# Patient Record
Sex: Female | Born: 1975 | Race: White | Marital: Married | Smoking: Never smoker
Health system: Southern US, Community
[De-identification: ages and names within clinical notes are randomized; demographics above are authoritative.]

## PROBLEM LIST (undated history)

## (undated) MED FILL — Dexamethasone Sodium Phosphate Inj 100 MG/10ML: INTRAMUSCULAR | Qty: 1 | Status: AC

---

## 2021-03-13 ENCOUNTER — Other Ambulatory Visit: Payer: Self-pay | Admitting: Surgical Oncology

## 2021-03-13 DIAGNOSIS — R9389 Abnormal findings on diagnostic imaging of other specified body structures: Secondary | ICD-10-CM

## 2021-03-21 ENCOUNTER — Other Ambulatory Visit: Payer: Self-pay | Admitting: Surgical Oncology

## 2021-03-21 DIAGNOSIS — N6320 Unspecified lump in the left breast, unspecified quadrant: Secondary | ICD-10-CM

## 2021-03-30 ENCOUNTER — Other Ambulatory Visit: Payer: Self-pay

## 2021-03-30 ENCOUNTER — Ambulatory Visit
Admission: RE | Admit: 2021-03-30 | Discharge: 2021-03-30 | Disposition: A | Discharge status: Home / Self Care | Payer: BC Managed Care – PPO | Source: Ambulatory Visit | Attending: Surgical Oncology | Admitting: Surgical Oncology

## 2021-03-30 DIAGNOSIS — N6320 Unspecified lump in the left breast, unspecified quadrant: Secondary | ICD-10-CM

## 2021-03-30 IMAGING — MR MR BREAST BILAT WO/W CM
11 of 13 series · 29 of 48 positions shown · IV contrast (7 ml gadavist)
Comparison: Previous exam(s).
COMPARISON: Previous exam(s).

Addendum:
CLINICAL DATA: 45-year-old female with history of lumpectomy for
left breast infiltrating lobular carcinoma with metastatic axillary
adenopathy. Patient complains of left breast pain. Patient had an
MRI of the breast on [DATE] at [REDACTED] in [HOSPITAL],
[facility] which showed an determinate right breast mass. Sonographic
evaluation the right breast was performed on [DATE]. The mass
seen on MRI was not seen sonographically. MR guided core biopsy
recommended.

EXAM:
BILATERAL BREAST MRI WITH AND WITHOUT CONTRAST
TECHNIQUE: Multiplanar, multisequence MR images of both breasts were obtained
prior to and following the intravenous administration of 7 ml of
Gadavist

[Series 2: t2_tirm_tra ipat (a-p) · axial · 3.0mm · 0.78mm/px · 1 of 70 slices shown]
[im 1/70]
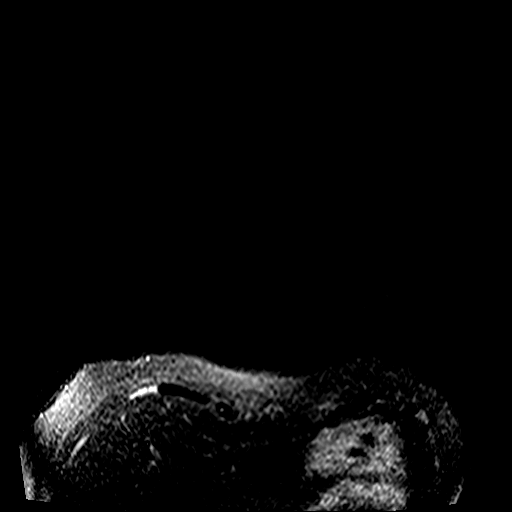

[Series 3: fl3d pre-cm non · axial · non-contrast · 1.2mm · 1.04mm/px · z∈[-138,+72]mm · 3 of 176 slices shown]
[im 1/176]
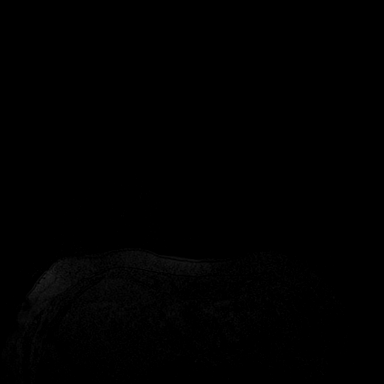
[im 88/176]
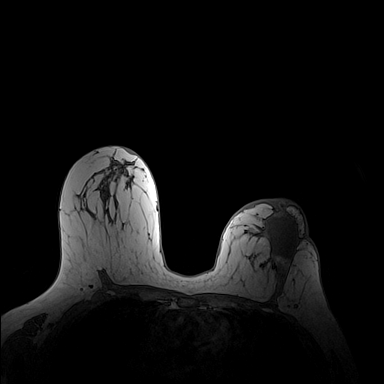
[im 176/176]
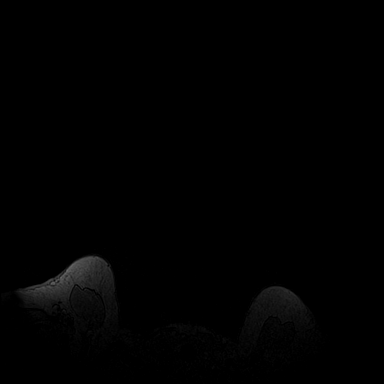

[Series 4: fl3d pre-cm · axial · non-contrast · 1.2mm · 1.04mm/px · z∈[-138,+72]mm · 3 of 176 slices shown]
[im 1/176]
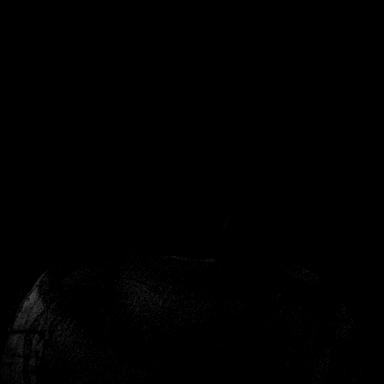
[im 88/176]
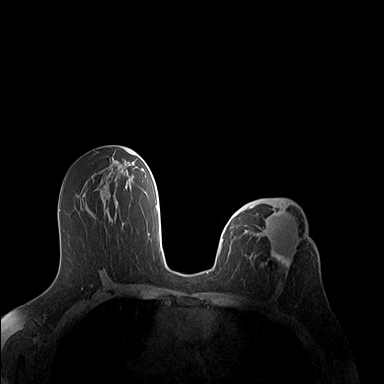
[im 176/176]
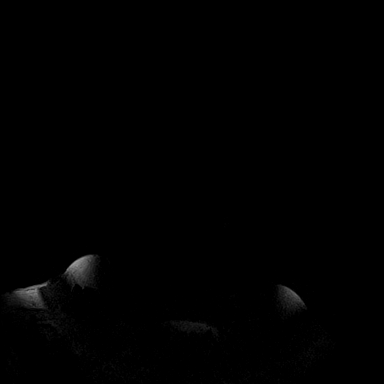

[Series 5: fl3d pre-cm 20 · axial · non-contrast · 1.2mm · 1.04mm/px · z∈[-138,+72]mm · 3 of 176 slices shown (1 of 3)]
[im 1/176]
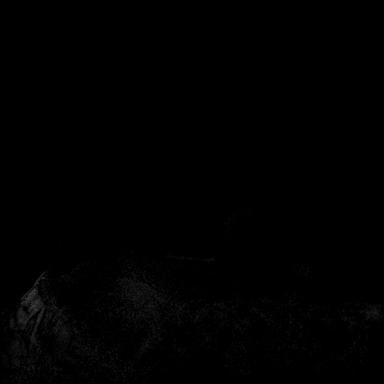
[im 88/176]
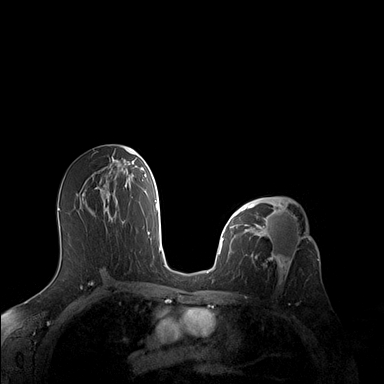
[im 176/176]
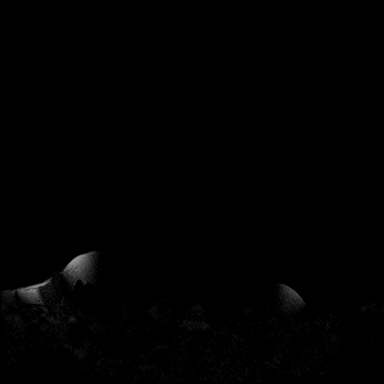

[Series 6: fl3d pre-cm 20 · axial · non-contrast · 1.2mm · 1.04mm/px · z∈[-138,+72]mm · 4 of 176 slices shown (2 of 3)]
[im 1/176]
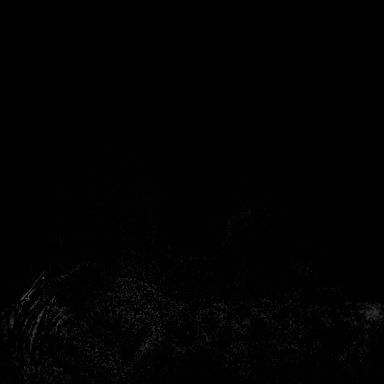
[im 59/176]
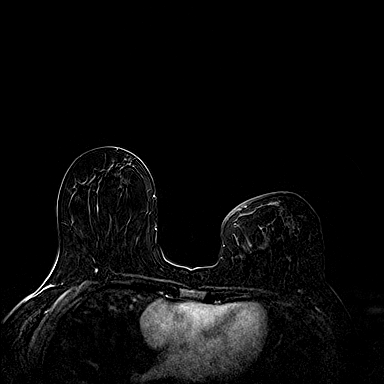
[im 117/176]
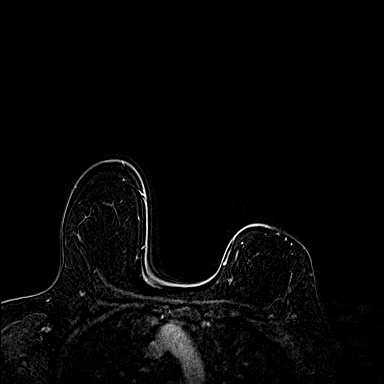
[im 176/176]
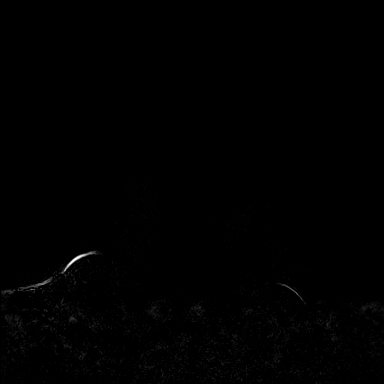

[Series 7: fl3d pre-cm 20 · axial · non-contrast · 211.2mm · 1.04mm/px · 1 of 1 slices shown (3 of 3)]
[im 1/1]
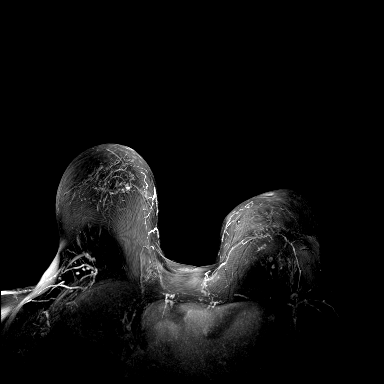

[Series 8: fl3d pre-cm 3min · axial · non-contrast · 1.2mm · 1.04mm/px · z∈[-138,+72]mm · 4 of 176 slices shown]
[im 1/176]
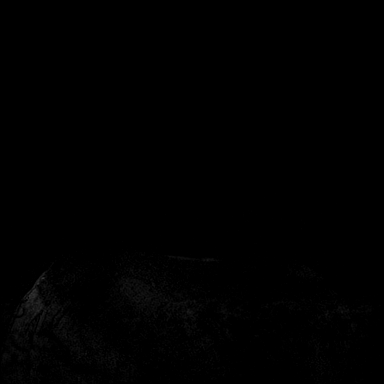
[im 59/176]
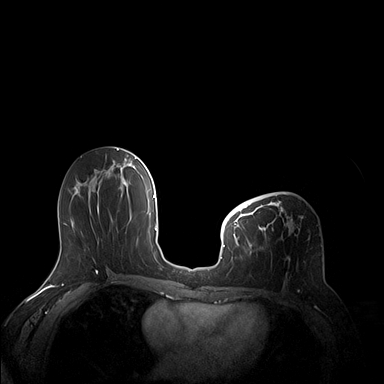
[im 117/176]
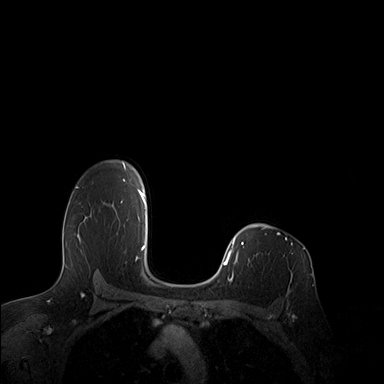
[im 176/176]
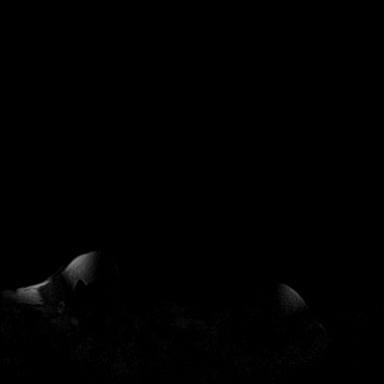

[Series 9: fl3d pre-cm 3min_sub · axial · non-contrast · 1.2mm · 1.04mm/px · z∈[-138,+72]mm · 4 of 176 slices shown]
[im 1/176]
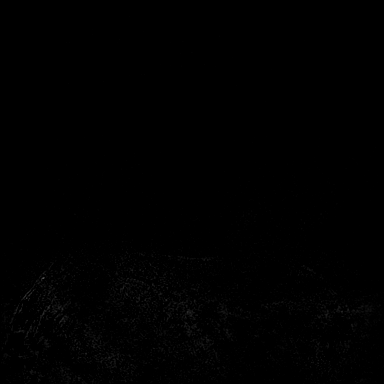
[im 59/176]
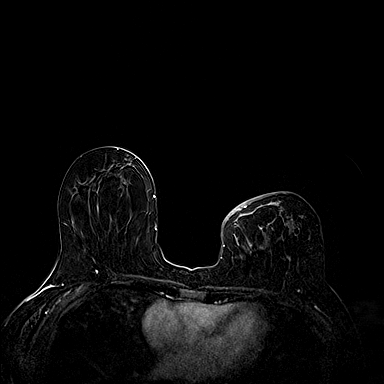
[im 117/176]
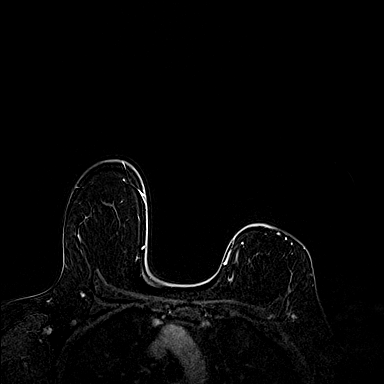
[im 176/176]
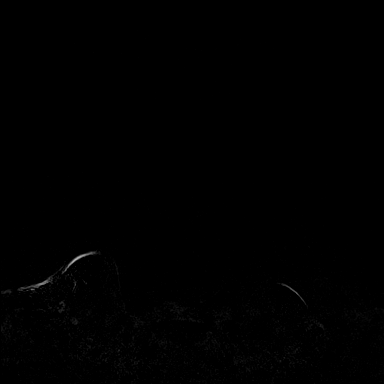

[Series 10: fl3d pre-cm 3min_sub_mip_tra · axial · non-contrast · 211.2mm · 1.04mm/px · 1 of 1 slices shown]
[im 1/1]
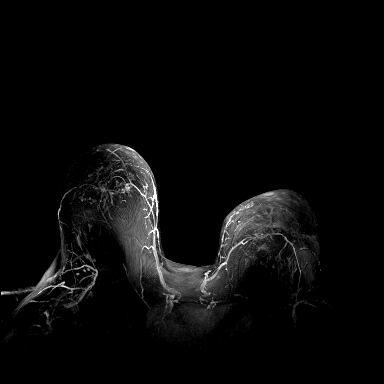

[Series 11: fl3d pre-cm 5 · axial · non-contrast · 1.2mm · 1.04mm/px · z∈[-138,+72]mm · 4 of 176 slices shown (1 of 2)]
[im 1/176]
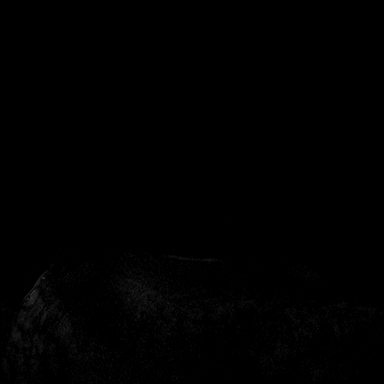
[im 59/176]
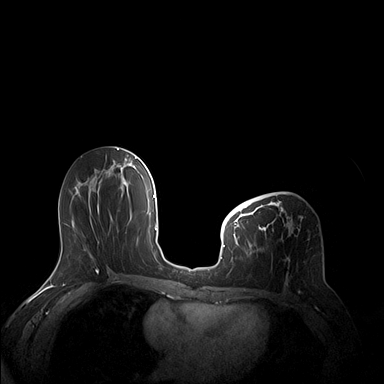
[im 117/176]
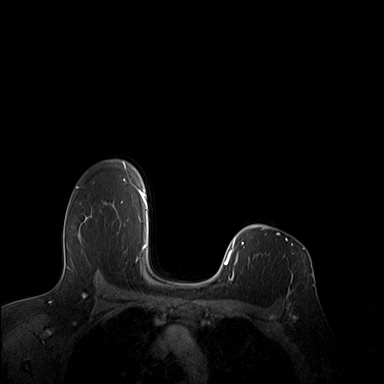
[im 176/176]
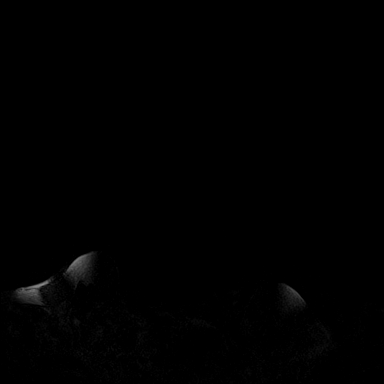

[Series 12: fl3d pre-cm 5 · axial · non-contrast · 1.2mm · 1.04mm/px · 1 of 176 slices shown (2 of 2)]
[im 1/176]
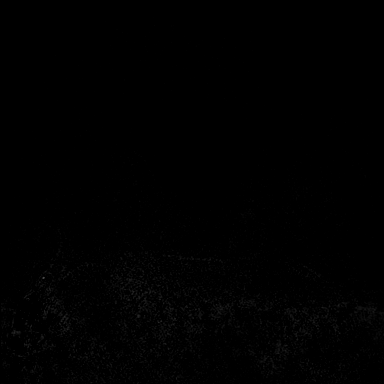

[29 of 48 positions shown; findings below may reference images not displayed]

Three-dimensional MR images were rendered by post-processing of the
original MR data on an independent workstation. The
three-dimensional MR images were interpreted, and findings are
reported in the following complete MRI report for this study. Three
dimensional images were evaluated at the independent interpreting
workstation using the DynaCAD thin client.
FINDINGS: Breast composition: b. Scattered fibroglandular tissue.

Background parenchymal enhancement: Moderate.

Right breast: There is a 5 mm enhancing mass in the anterior third
of the upper-inner quadrant of the right breast (series 6, image
85). No additional enhancing mass seen in the right breast.

Left breast: There are lumpectomy changes with a large seroma in the
upper-outer quadrant of the left breast. No suspicious enhancement
in the left breast.

Lymph nodes: There is a mildly prominent left axillary lymph node
(series 6, image 103) with cortical thickening measuring up to 4 mm.

Ancillary findings:  None.
IMPRESSION: Indeterminate 5 mm mass in the upper-inner quadrant of the right
breast. Solitary mildly prominent left axillary lymph node with
cortex measuring 4 mm.

RECOMMENDATION:
MR guided core biopsy of the 5 mm mass in the upper-inner quadrant
of the right breast is recommended.

Sonographic evaluation of the left axilla is recommended for a
mildly prominent lymph node given the patient's history of
metastatic adenopathy.

BI-RADS CATEGORY  4: Suspicious.

ADDENDUM:
This is an addendum to an MRI performed on [DATE]. There is a
mildly prominent left subpectoral lymph node measuring 8 mm (series
6 image 54). It is slightly less prominent than the MRI dated
[DATE] and may be reactive. Further evaluation with ultrasound
is recommended. The patient will be recalled for a solitary
prominent left axillary lymph node. At that time sonographic
evaluation of the mildly prominent left subpectoral lymph node is
also recommended.

*** End of Addendum ***
Three-dimensional MR images were rendered by post-processing of the
original MR data on an independent workstation. The
three-dimensional MR images were interpreted, and findings are
reported in the following complete MRI report for this study. Three
dimensional images were evaluated at the independent interpreting
workstation using the DynaCAD thin client.
FINDINGS: Breast composition: b. Scattered fibroglandular tissue.

Background parenchymal enhancement: Moderate.

Right breast: There is a 5 mm enhancing mass in the anterior third
of the upper-inner quadrant of the right breast (series 6, image
85). No additional enhancing mass seen in the right breast.

Left breast: There are lumpectomy changes with a large seroma in the
upper-outer quadrant of the left breast. No suspicious enhancement
in the left breast.

Lymph nodes: There is a mildly prominent left axillary lymph node
(series 6, image 103) with cortical thickening measuring up to 4 mm.

Ancillary findings:  None.
IMPRESSION: Indeterminate 5 mm mass in the upper-inner quadrant of the right
breast. Solitary mildly prominent left axillary lymph node with
cortex measuring 4 mm.

RECOMMENDATION:
MR guided core biopsy of the 5 mm mass in the upper-inner quadrant
of the right breast is recommended.

Sonographic evaluation of the left axilla is recommended for a
mildly prominent lymph node given the patient's history of
metastatic adenopathy.

BI-RADS CATEGORY  4: Suspicious.

## 2021-03-30 MED ORDER — GADOBUTROL 1 MMOL/ML IV SOLN
7.0000 mL | Freq: Once | INTRAVENOUS | Status: AC | PRN
  Administered 2021-03-30: 7 mL via INTRAVENOUS

## 2021-04-02 ENCOUNTER — Other Ambulatory Visit: Payer: Self-pay | Admitting: Surgical Oncology

## 2021-04-02 DIAGNOSIS — R599 Enlarged lymph nodes, unspecified: Secondary | ICD-10-CM

## 2021-04-06 ENCOUNTER — Ambulatory Visit
Admission: RE | Admit: 2021-04-06 | Discharge: 2021-04-06 | Disposition: A | Discharge status: Home / Self Care | Payer: BC Managed Care – PPO | Source: Ambulatory Visit | Attending: Surgical Oncology | Admitting: Surgical Oncology

## 2021-04-06 DIAGNOSIS — R599 Enlarged lymph nodes, unspecified: Secondary | ICD-10-CM

## 2021-04-06 DIAGNOSIS — R9389 Abnormal findings on diagnostic imaging of other specified body structures: Secondary | ICD-10-CM

## 2021-04-06 HISTORY — PX: BREAST BIOPSY: SHX20

## 2021-04-06 IMAGING — US US AXILLARY NODE CORE BIOPSY LEFT
1 series · 7 of 7 positions shown · non-contrast
Comparison: Previous exam(s).
COMPARISON: Previous exam(s).

Addendum:
CLINICAL DATA: Patient with cortically thickened left breast
intramammary node 3 o'clock position.

EXAM:
US AXILLARY NODE CORE BIOPSY LEFT

[Series 1: us axillary node core biopsy left · 0.06mm/px · 7 of 7 slices shown]
[im 1/7]
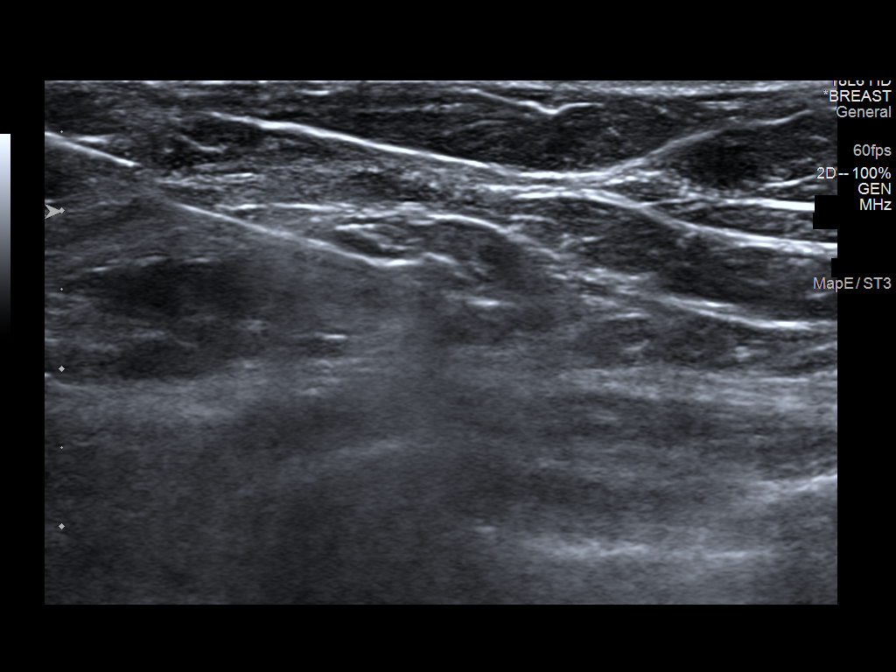
[im 2/7]
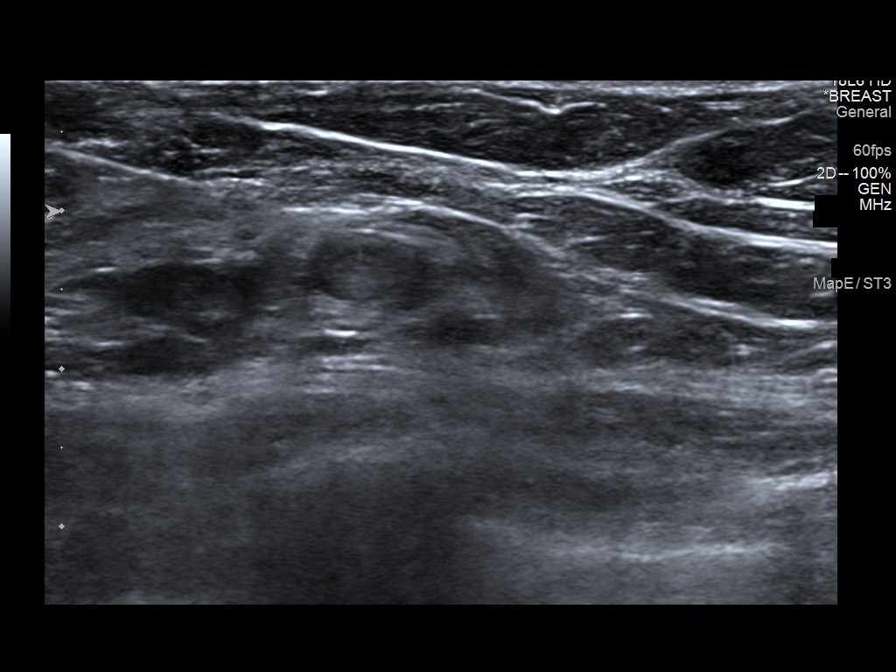
[im 3/7]
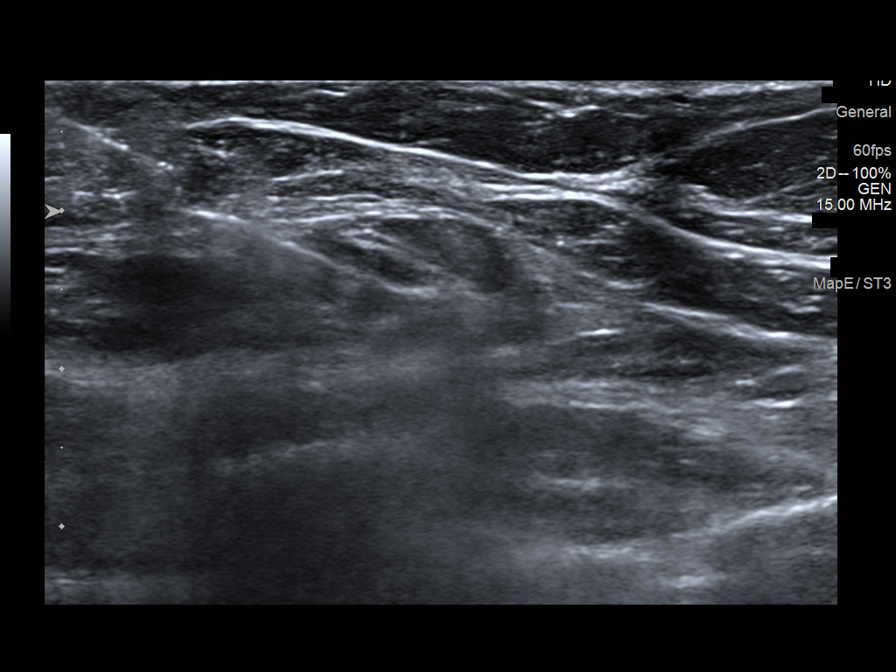
[im 4/7]
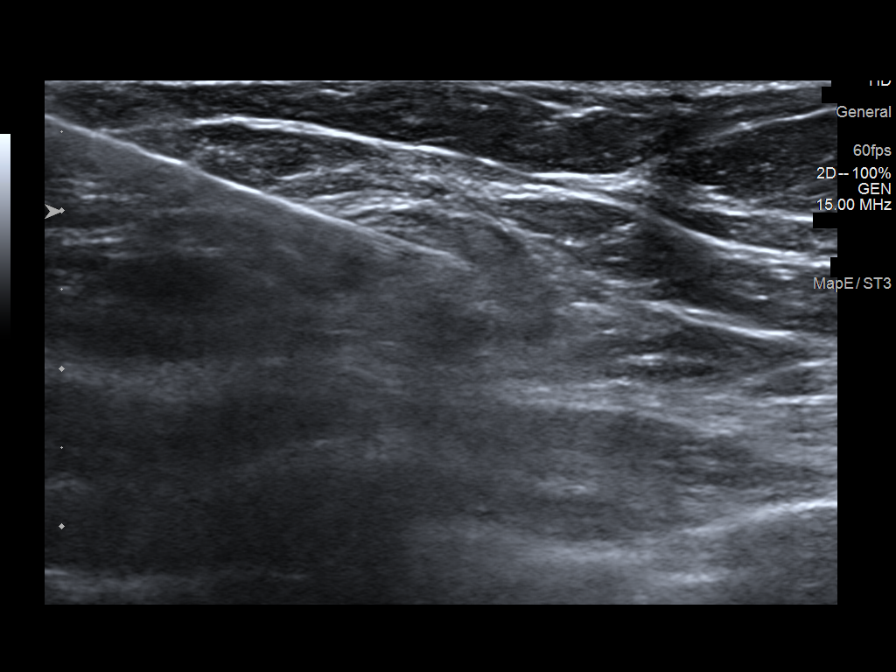
[im 5/7]
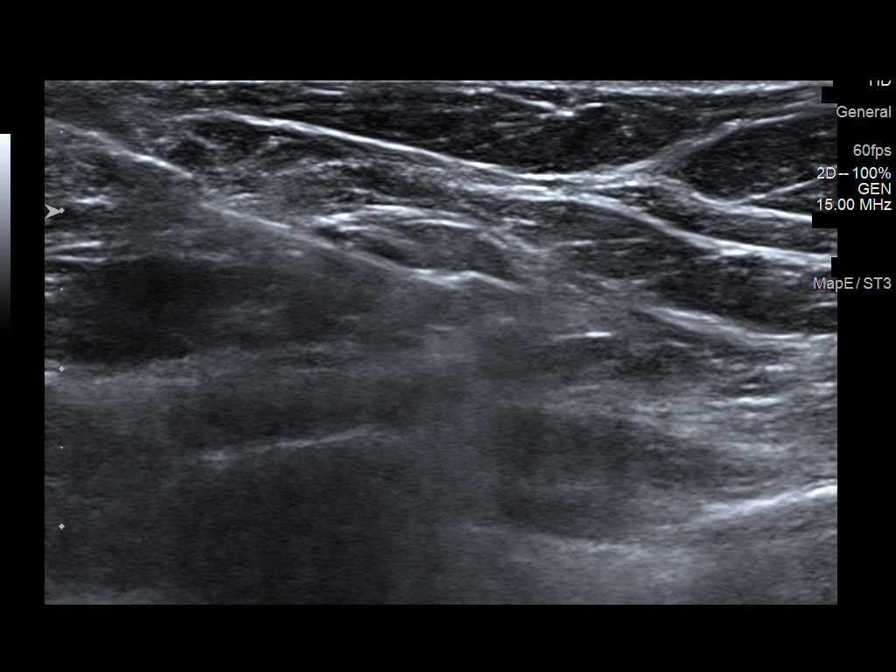
[im 6/7]
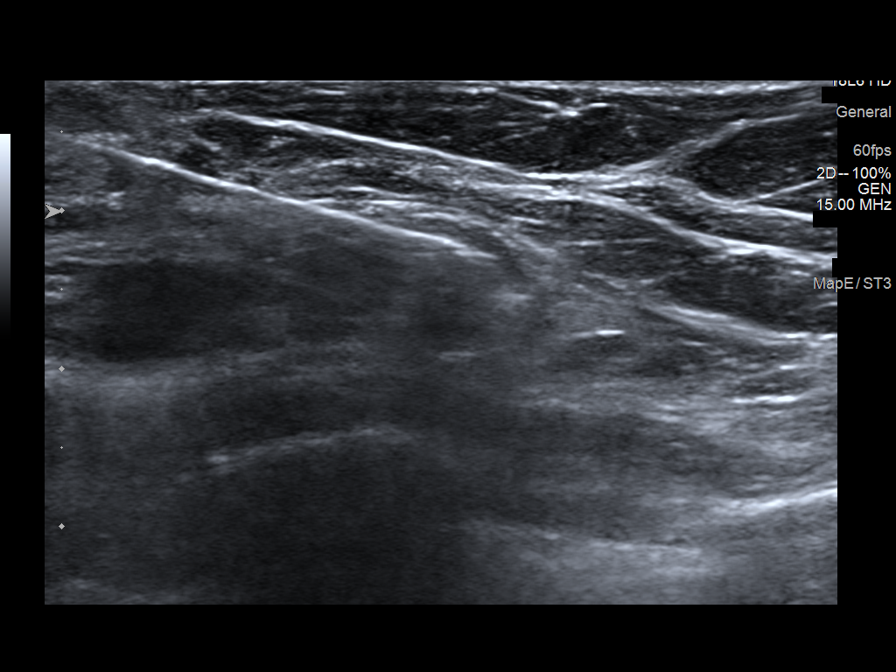
[im 7/7]
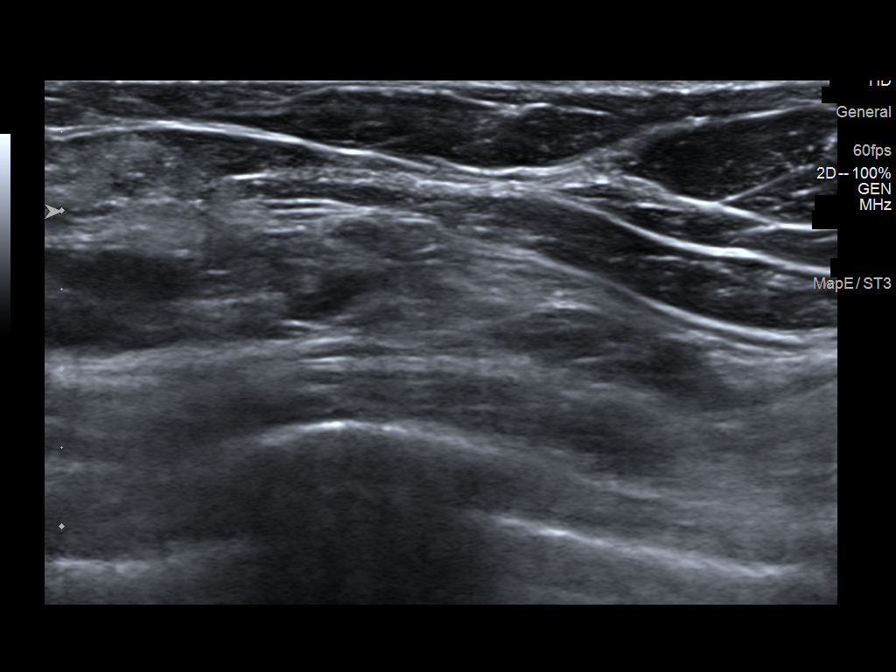

[7 of 7 positions shown; findings below may reference images not displayed]



Using sterile technique and 1% Lidocaine as local anesthetic, under
direct ultrasound visualization, a 14 gauge VOCAL device was
used to perform biopsy of intramammary lymph node left breast 3
o'clock position using a lateral approach. At the conclusion of the
procedure HydroMARK tissue marker clip was deployed into the biopsy
cavity. Follow up 2 view mammogram was performed and dictated
separately.
IMPRESSION: Ultrasound guided biopsy of left breast intramammary lymph node 3
o'clock position. No apparent complications.

ADDENDUM:
Pathology revealed PSEUDOANGIOMATOUS STROMAL HYPERPLASIA- BLUNT DUCT
ADENOSIS- NO MALIGNANCY IDENTIFIED of the RIGHT breast, medial
(barbell clip). This was found to be concordant by Dr. VOCAL.

Pathology revealed NODAL TISSUE WITH PIGMENTED HISTIOCYTES- NO
MALIGNANCY IDENTIFIED of the LEFT breast intramammary lymph node,
lateral, 3 o'clock (Hydromark clip). This was found to be concordant
by Dr. VOCAL.

Pathology results were discussed with the patient by telephone. The
patient reported doing well after the biopsies with tenderness at
the sites. Post biopsy instructions and care were reviewed and
questions were answered. The patient was encouraged to call The

Patient is scheduled to see Dr. VOCAL of [REDACTED] -[HOSPITAL] on [DATE].

Pathology results reported by VOCAL RN on [DATE].



Using sterile technique and 1% Lidocaine as local anesthetic, under
direct ultrasound visualization, a 14 gauge VOCAL device was
used to perform biopsy of intramammary lymph node left breast 3
o'clock position using a lateral approach. At the conclusion of the
procedure HydroMARK tissue marker clip was deployed into the biopsy
cavity. Follow up 2 view mammogram was performed and dictated
separately.
IMPRESSION: Ultrasound guided biopsy of left breast intramammary lymph node 3
o'clock position. No apparent complications.

## 2021-04-06 IMAGING — MR MR BREAST BX W/ LOC DEV 1ST LEASION IMAGE BX SPEC MR GUIDE*R*
7 of 10 series · 33 of 48 positions shown · IV contrast (gadavist)
Comparison: Previous exams.
COMPARISON: Previous exams.

Addendum:
CLINICAL DATA: Patient with indeterminate mass medial right breast.

EXAM:
MRI GUIDED CORE NEEDLE BIOPSY OF THE RIGHT BREAST
TECHNIQUE: Multiplanar, multisequence MR imaging of the right breast was
performed both before and after administration of intravenous
contrast.
CONTRAST:  7mL GADAVIST GADOBUTROL 1 MMOL/ML IV SOLN

[Series 4: fiducial unilateral · sagittal · 2.0mm · 1.33mm/px · 2 of 52 slices shown]
[im 1/52]
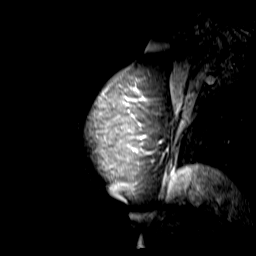
[im 52/52]
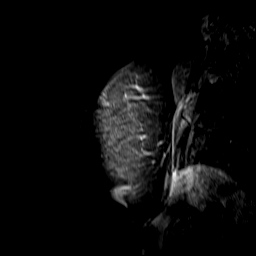

[Series 5: dynamic pre · axial · non-contrast · 1.3mm · 0.73mm/px · z∈[-110,+139]mm · 6 of 192 slices shown]
[im 1/192]
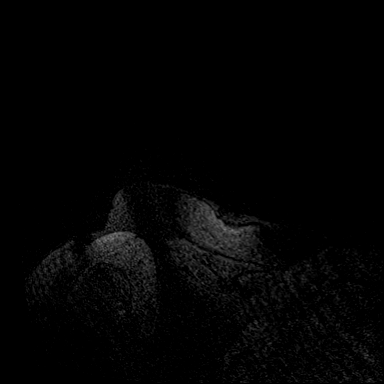
[im 39/192]
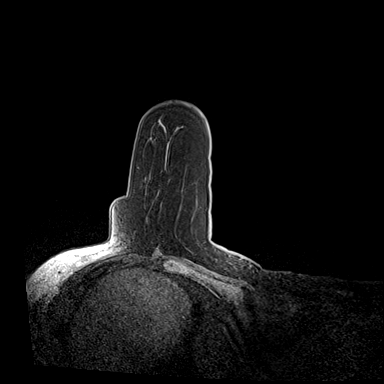
[im 77/192]
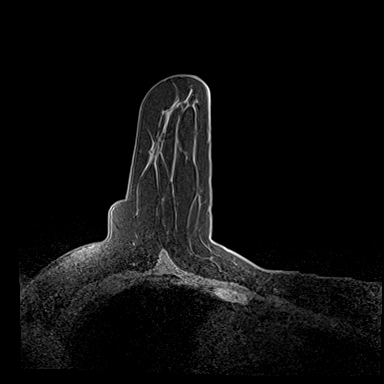
[im 115/192]
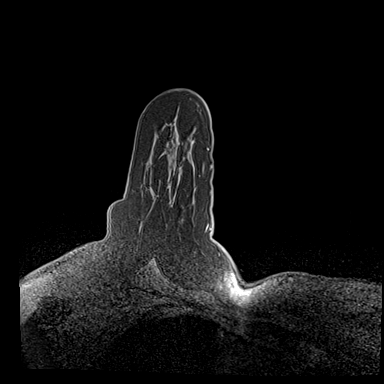
[im 153/192]
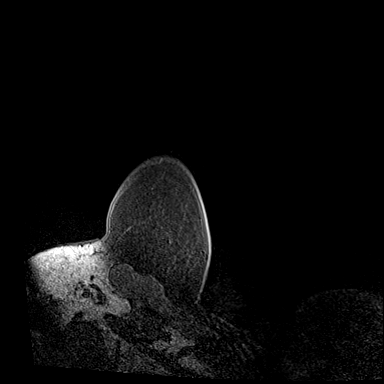
[im 192/192]
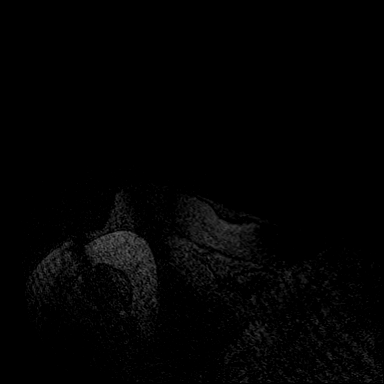

[Series 6: dynamic post 20 · axial · 1.3mm · 0.73mm/px · z∈[-110,+139]mm · 5 of 192 slices shown (1 of 2)]
[im 1/192]
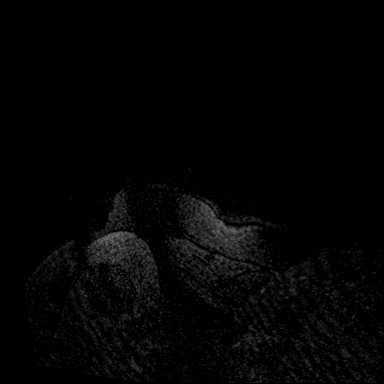
[im 48/192]
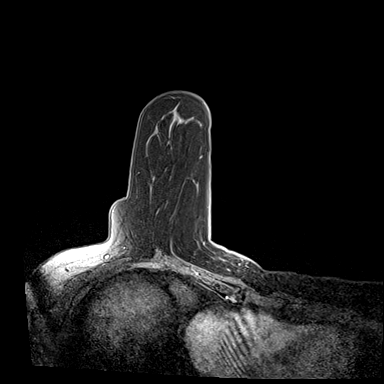
[im 96/192]
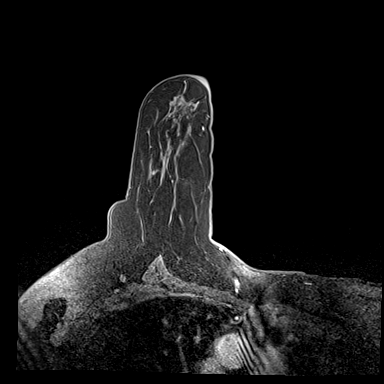
[im 144/192]
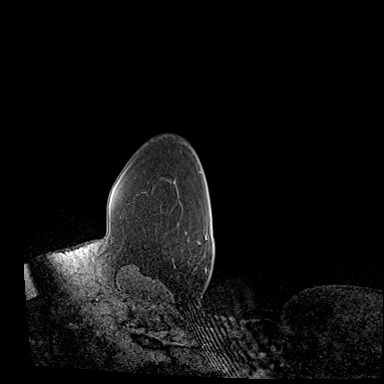
[im 192/192]
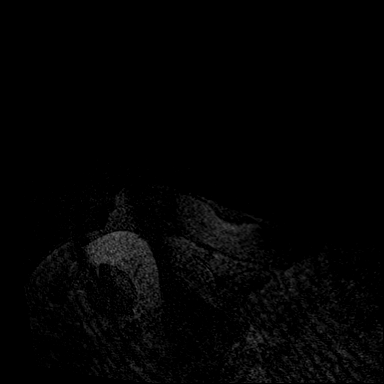

[Series 7: dynamic post 20 · axial · 1.3mm · 0.73mm/px · z∈[-110,+139]mm · 5 of 192 slices shown (2 of 2)]
[im 1/192]
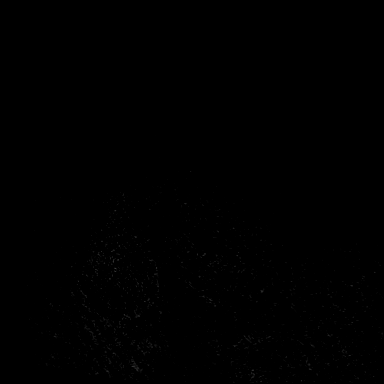
[im 48/192]
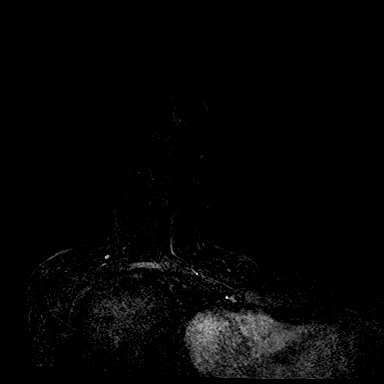
[im 96/192]
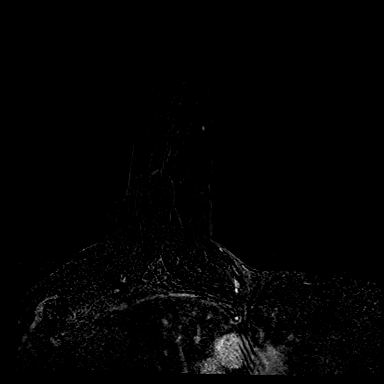
[im 144/192]
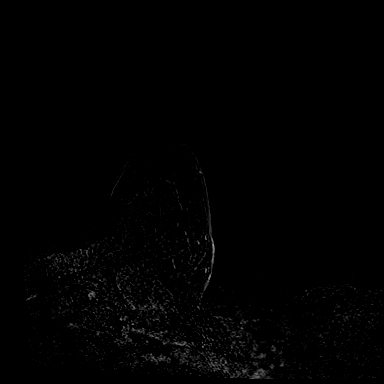
[im 192/192]
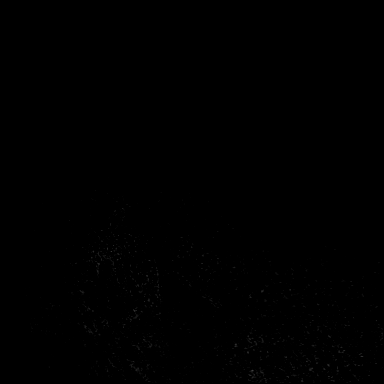

[Series 8: dynamic post 3 · axial · 1.3mm · 0.73mm/px · z∈[-110,+139]mm · 5 of 192 slices shown (1 of 2)]
[im 1/192]
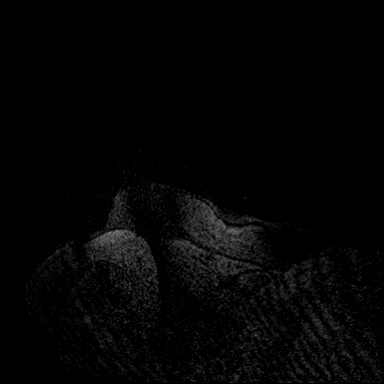
[im 48/192]
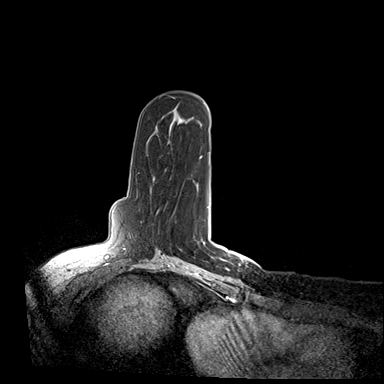
[im 96/192]
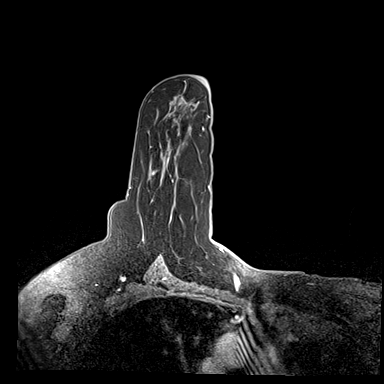
[im 144/192]
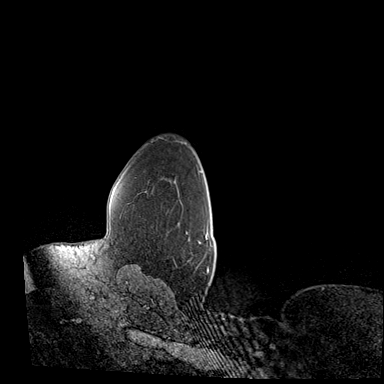
[im 192/192]
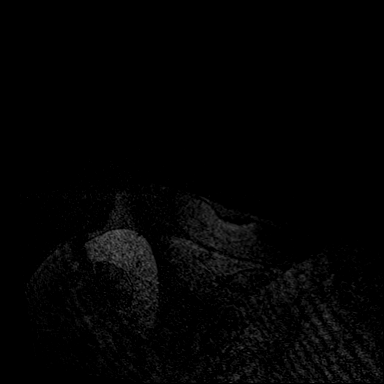

[Series 9: dynamic post 3 · axial · 1.3mm · 0.73mm/px · z∈[-110,+139]mm · 5 of 192 slices shown (2 of 2)]
[im 1/192]
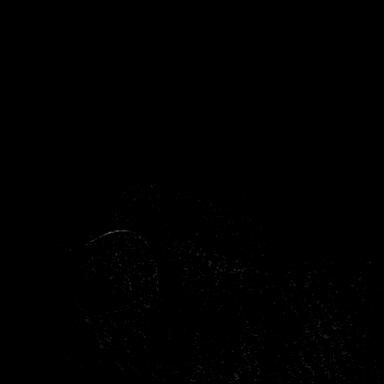
[im 48/192]
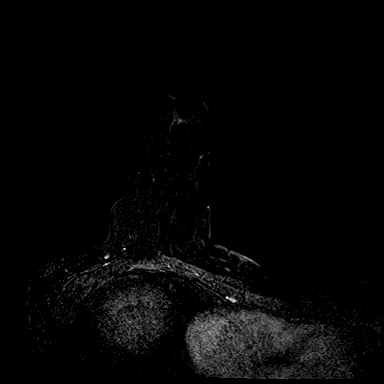
[im 96/192]
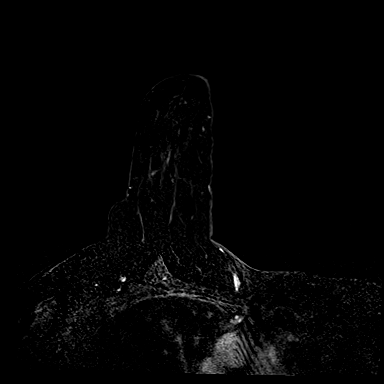
[im 144/192]
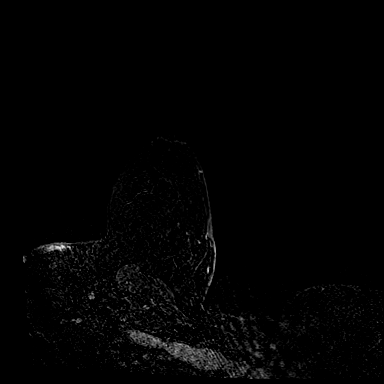
[im 192/192]
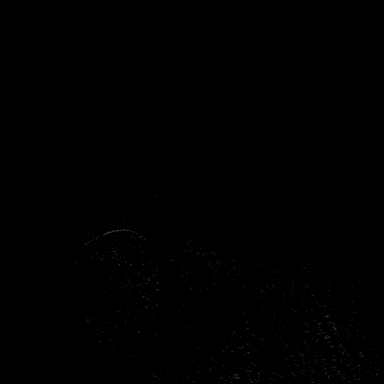

[Series 10: needle confirmation · axial · 1.3mm · 0.73mm/px · z∈[-110,+139]mm · 5 of 192 slices shown]
[im 1/192]
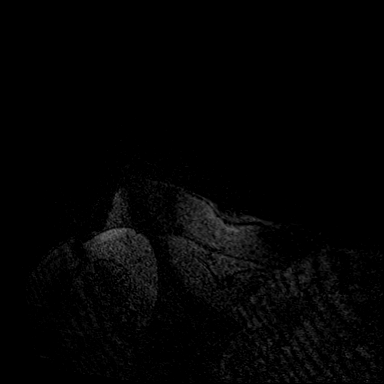
[im 48/192]
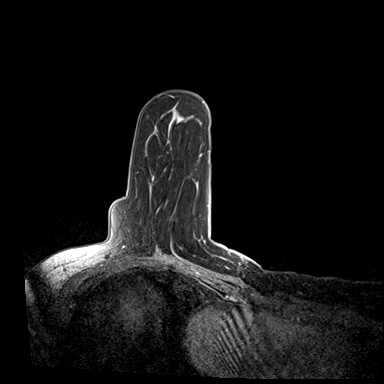
[im 96/192]
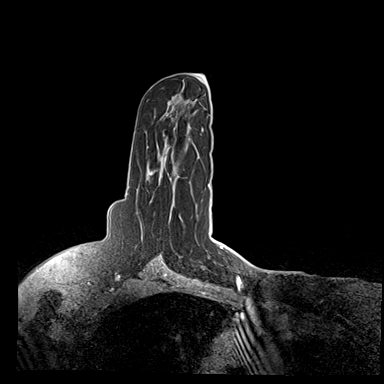
[im 144/192]
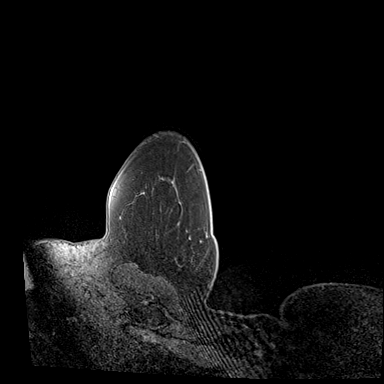
[im 192/192]
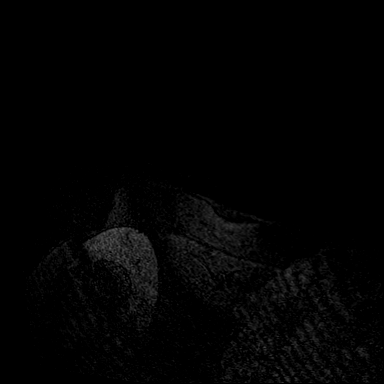

[33 of 48 positions shown; findings below may reference images not displayed]

FINDINGS: I met with the patient, and we discussed the procedure of MRI guided
biopsy, including risks, benefits, and alternatives. Specifically,
we discussed the risks of infection, bleeding, tissue injury, clip
migration, and inadequate sampling. Informed, written consent was
given. The usual time out protocol was performed immediately prior
to the procedure.

Using sterile technique, 1% Lidocaine, MRI guidance, and a 9 gauge
vacuum assisted device, biopsy was performed of mass within the
medial right breast using a medial approach. At the conclusion of
the procedure, a barbell tissue marker clip was deployed into the
biopsy cavity. Follow-up 2-view mammogram was performed and dictated
separately.
IMPRESSION: MRI guided biopsy of medial right breast mass. No apparent
complications.

ADDENDUM:
Pathology revealed PSEUDOANGIOMATOUS STROMAL HYPERPLASIA- BLUNT DUCT
ADENOSIS- NO MALIGNANCY IDENTIFIED of the RIGHT breast, medial
(barbell clip). This was found to be concordant by Dr. FRSAN.

Pathology revealed NODAL TISSUE WITH PIGMENTED HISTIOCYTES- NO
MALIGNANCY IDENTIFIED of the LEFT breast intramammary lymph node,
lateral, 3 o'clock (Hydromark clip). This was found to be concordant
by Dr. FRSAN.

Pathology results were discussed with the patient by telephone. The
patient reported doing well after the biopsies with tenderness at
the sites. Post biopsy instructions and care were reviewed and
questions were answered. The patient was encouraged to call The

Patient is scheduled to see Dr. FRSAN of [REDACTED] -[HOSPITAL] on [DATE].

Pathology results reported by FRSAN RN on [DATE].

*** End of Addendum ***
FINDINGS: I met with the patient, and we discussed the procedure of MRI guided
biopsy, including risks, benefits, and alternatives. Specifically,
we discussed the risks of infection, bleeding, tissue injury, clip
migration, and inadequate sampling. Informed, written consent was
given. The usual time out protocol was performed immediately prior
to the procedure.

Using sterile technique, 1% Lidocaine, MRI guidance, and a 9 gauge
vacuum assisted device, biopsy was performed of mass within the
medial right breast using a medial approach. At the conclusion of
the procedure, a barbell tissue marker clip was deployed into the
biopsy cavity. Follow-up 2-view mammogram was performed and dictated
separately.
IMPRESSION: MRI guided biopsy of medial right breast mass. No apparent
complications.

## 2021-04-06 IMAGING — US US AXILLARY LEFT
1 series · 5 of 5 positions shown · non-contrast
Comparison: MRI breast [DATE].

CLINICAL DATA: Patient with history of left breast cancer status
post surgical excision. Patient had indeterminate lymph node within
the low left axilla as well as indeterminate lymph node within the
retropectoral left axilla on recent MRI exam. Patient presents for
second-look ultrasound.

EXAM:
ULTRASOUND OF THE LEFT AXILLA

[Series 1: us axillary left · 0.10mm/px · 5 of 5 slices shown]
[im 1/5]
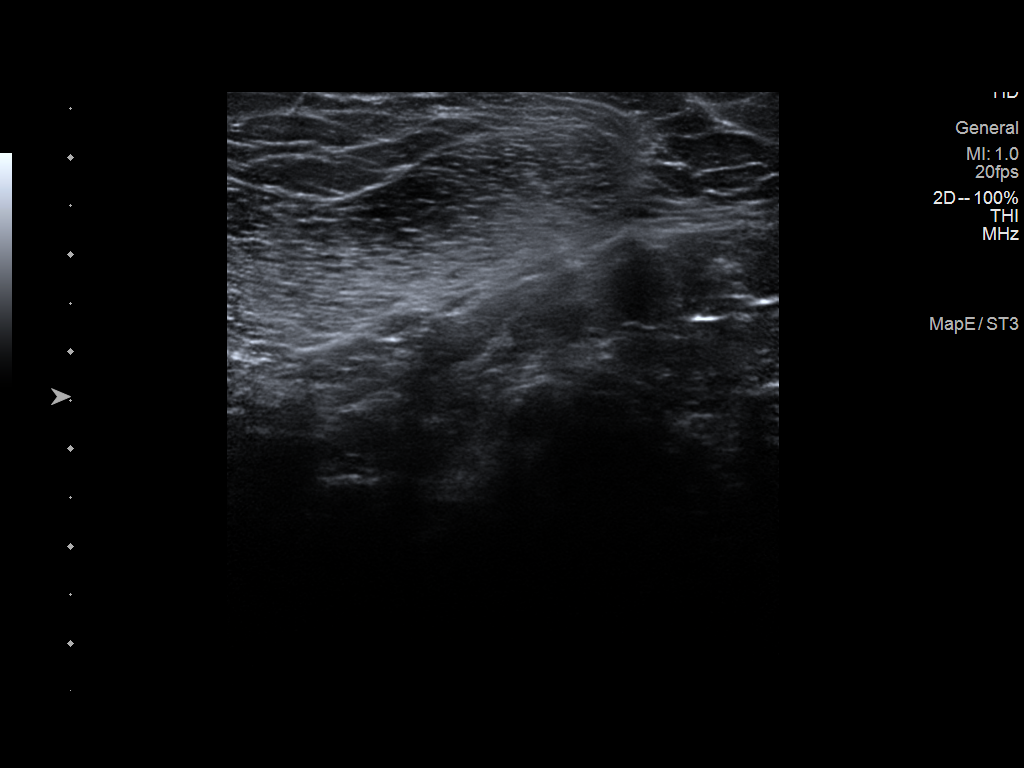
[im 2/5]
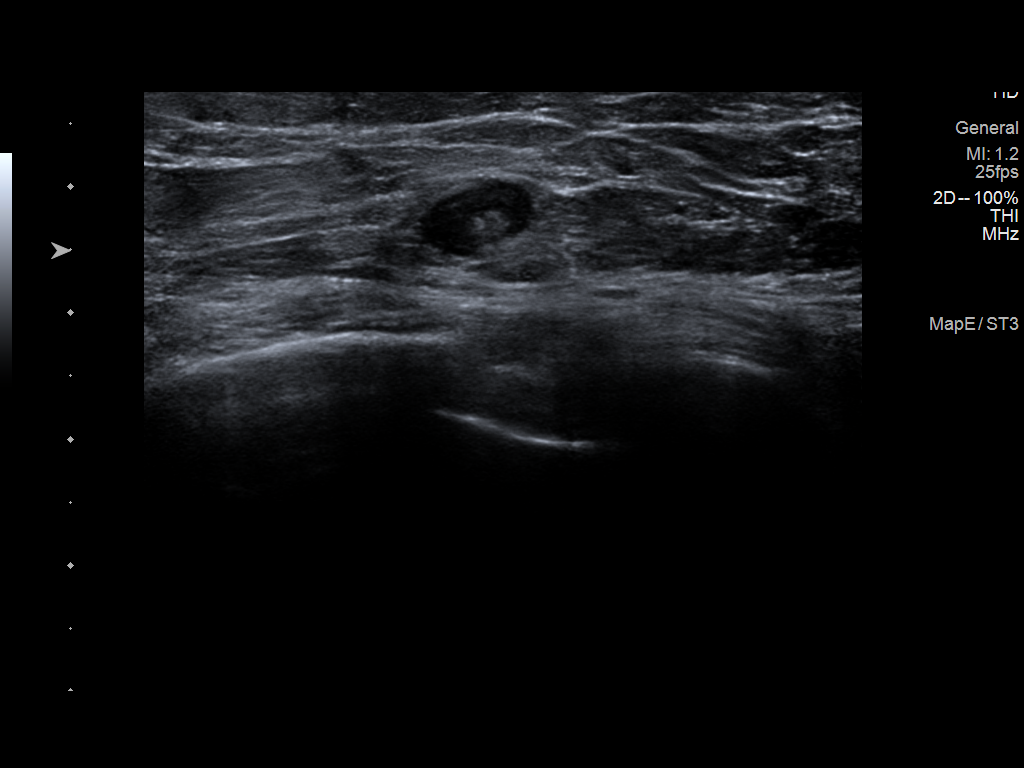
[im 3/5]
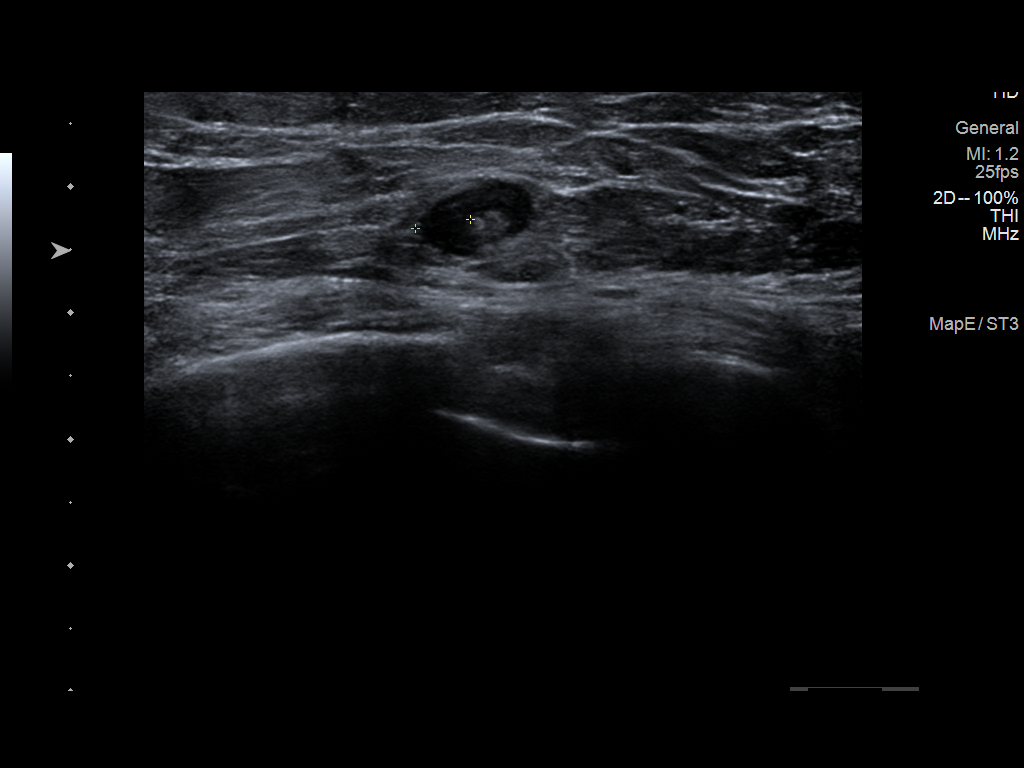
[im 4/5]
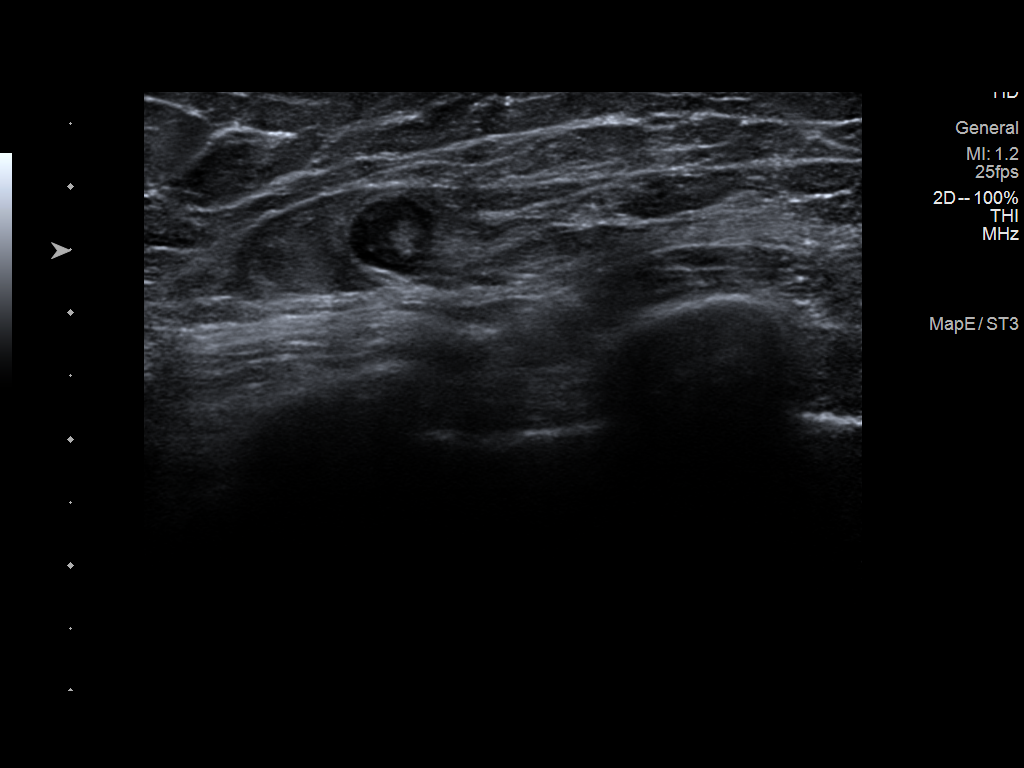
[im 5/5]
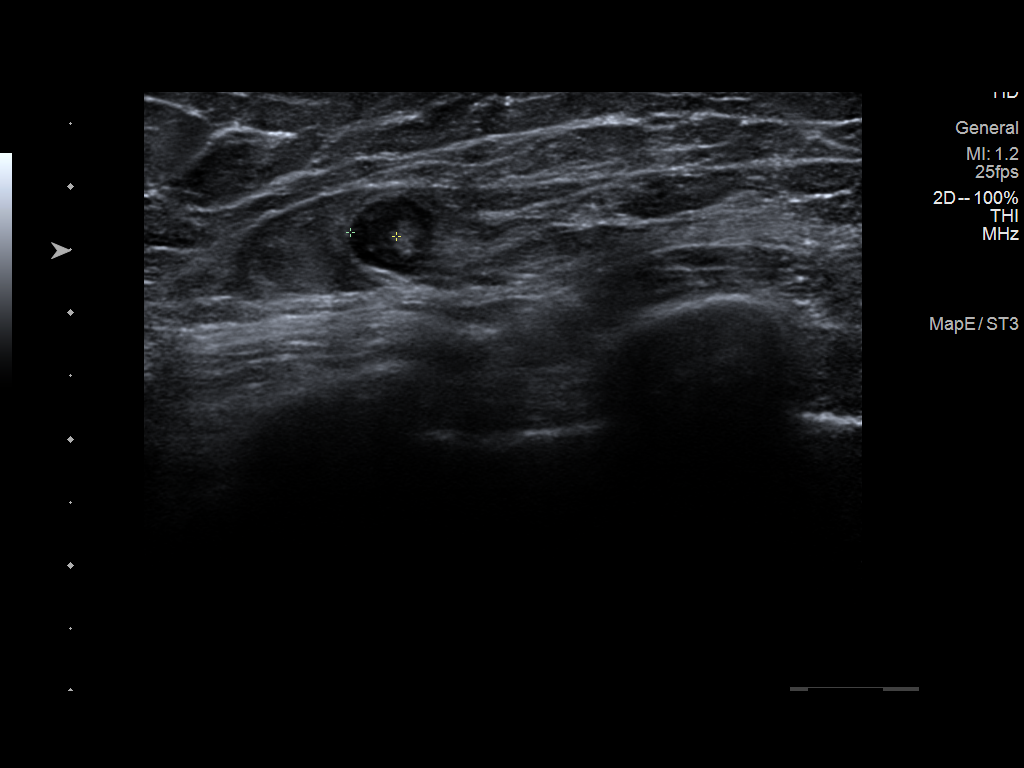

[5 of 5 positions shown; findings below may reference images not displayed]

FINDINGS: Ultrasound is performed, showing a mildly cortically thickened node
within the left breast 3 o'clock position 7 cm from nipple
corresponding with the low left axillary node on prior MRI.
Examination of the axilla and retropectoral upper-outer quadrant
left breast demonstrates no definite additional adenopathy. It is
likely that the additional lymph node identified on prior MRI is not
able to be visualized with ultrasound given the location.
IMPRESSION: 1. Indeterminate cortically thickened left breast node 3 o'clock
position 7 cm from nipple.
2. The additional node within the retropectoral left axilla is not
able to be visualized on ultrasound, likely secondary to deep
location.

RECOMMENDATION:
1. Ultrasound-guided core needle biopsy of the cortically thickened
node within the left breast 3 o'clock position 7 cm from the nipple
to assist with treatment planning. Note the biopsy will be performed
same day [DATE].
2. The additional node identified within the retropectoral
upper-outer left breast/left axilla is not able to be visualized on
ultrasound, likely secondary to deep location. Attention to this
node for treatment planning is recommended. This can be further
evaluated with PET scan if clinically warranted.

I have discussed the findings and recommendations with the patient.
If applicable, a reminder letter will be sent to the patient
regarding the next appointment.

BI-RADS CATEGORY  4: Suspicious.

## 2021-04-06 IMAGING — MG MM BREAST LOCALIZATION CLIP
4 series · 4 of 12 positions shown · non-contrast
Comparison: Previous exam(s).

CLINICAL DATA: Status post MRI guided core needle biopsy right
breast mass.

EXAM:
3D DIAGNOSTIC RIGHT MAMMOGRAM POST MRI BIOPSY

[R ML synth-2D]
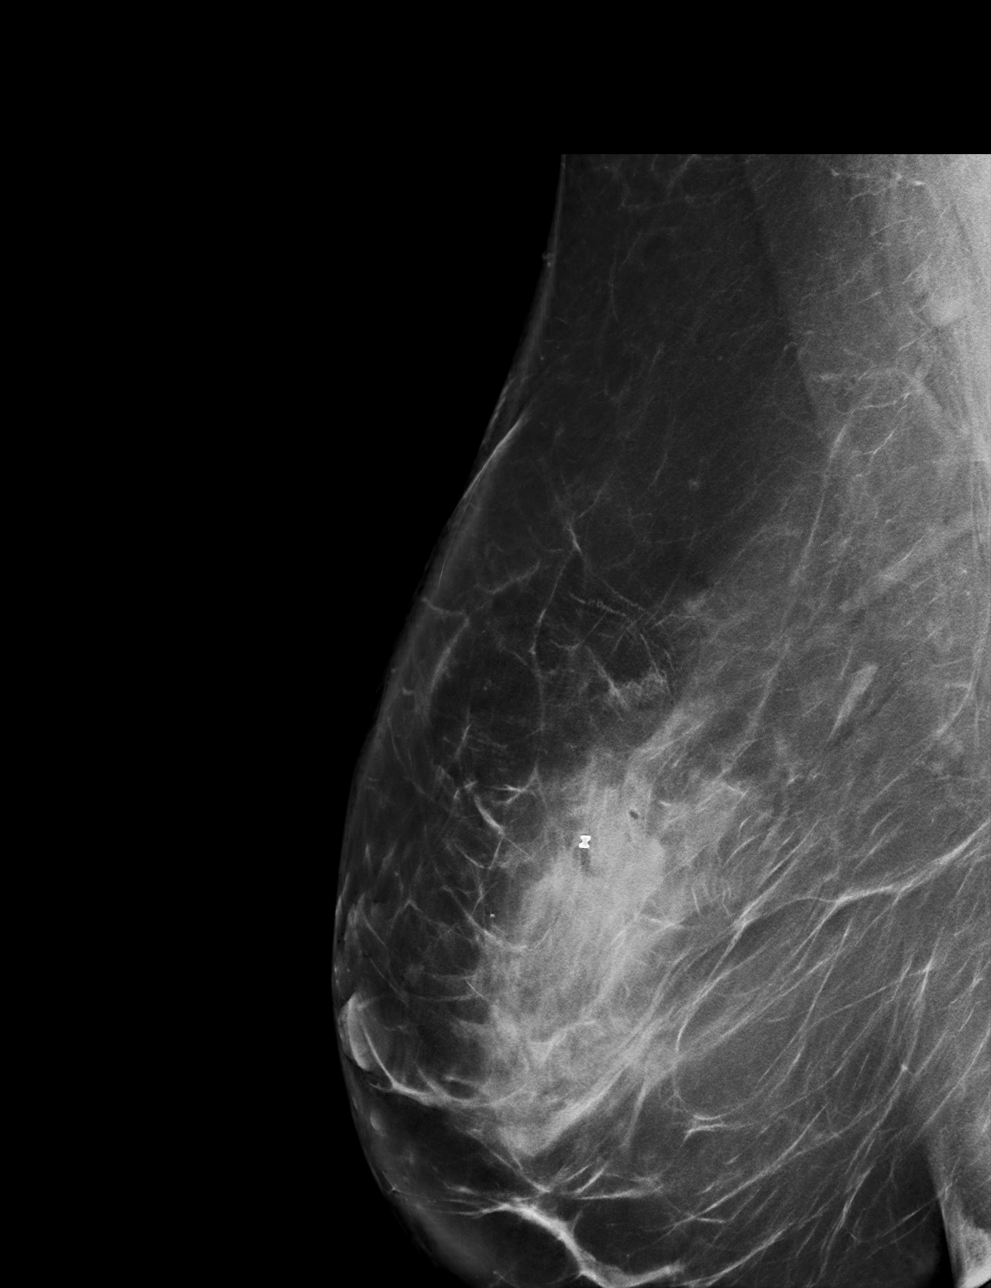

[R CC synth-2D]
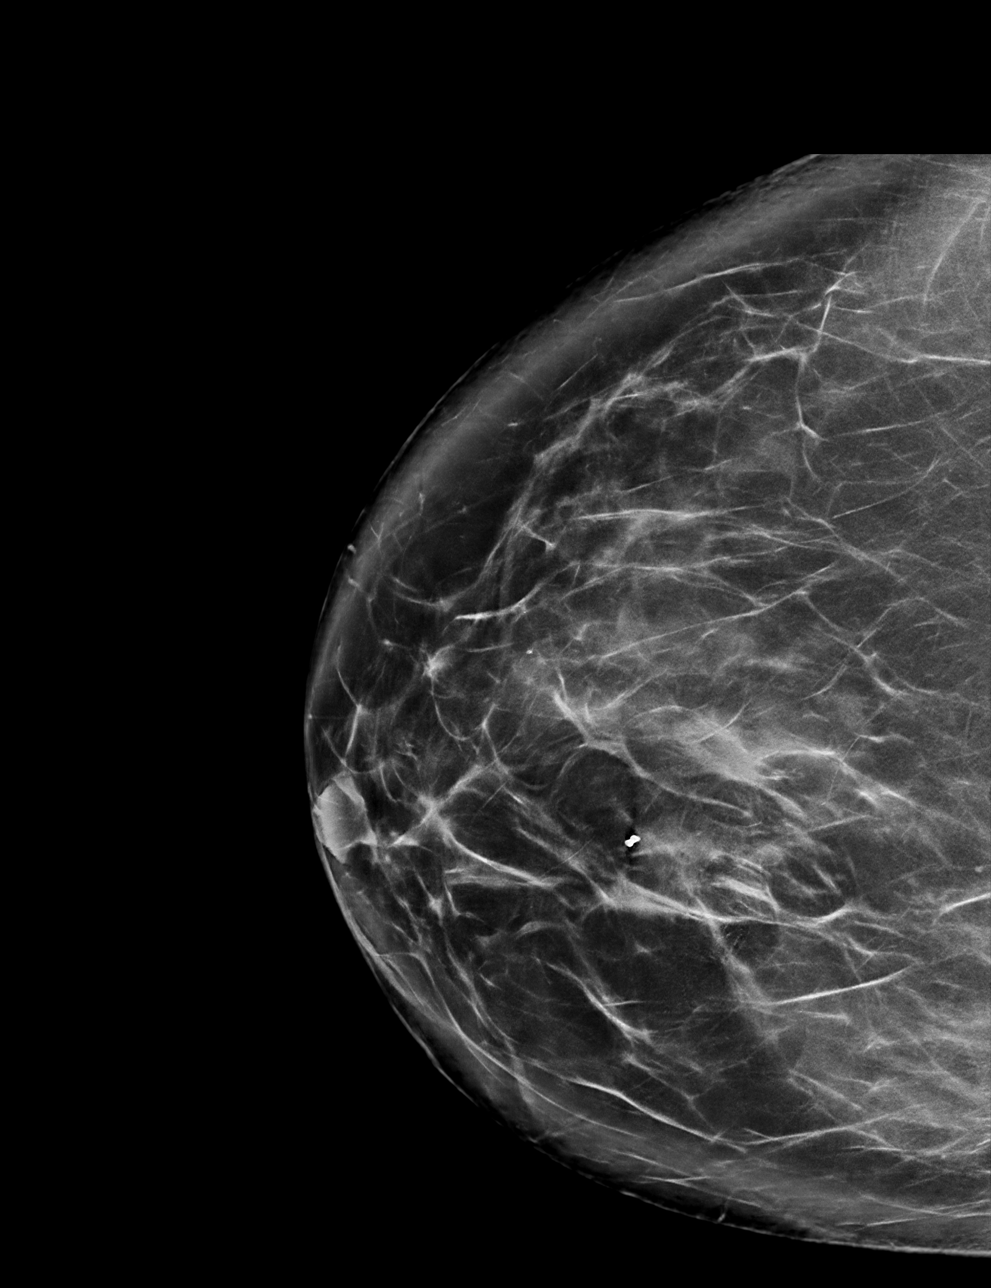

[R ML tomo · tomo slice 55/109.0]
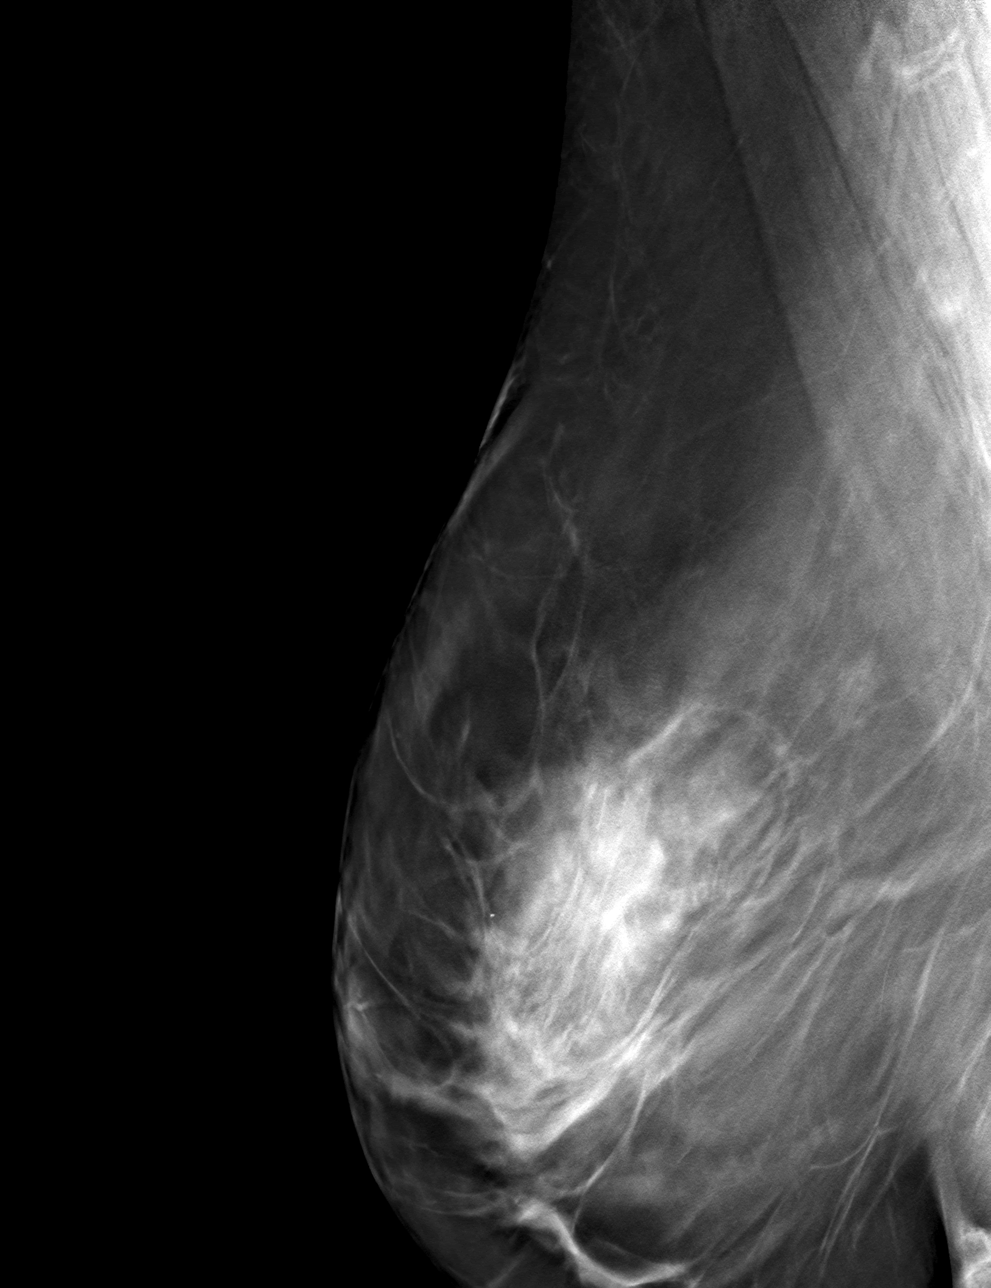

[R CC tomo · tomo slice 47/93.0]
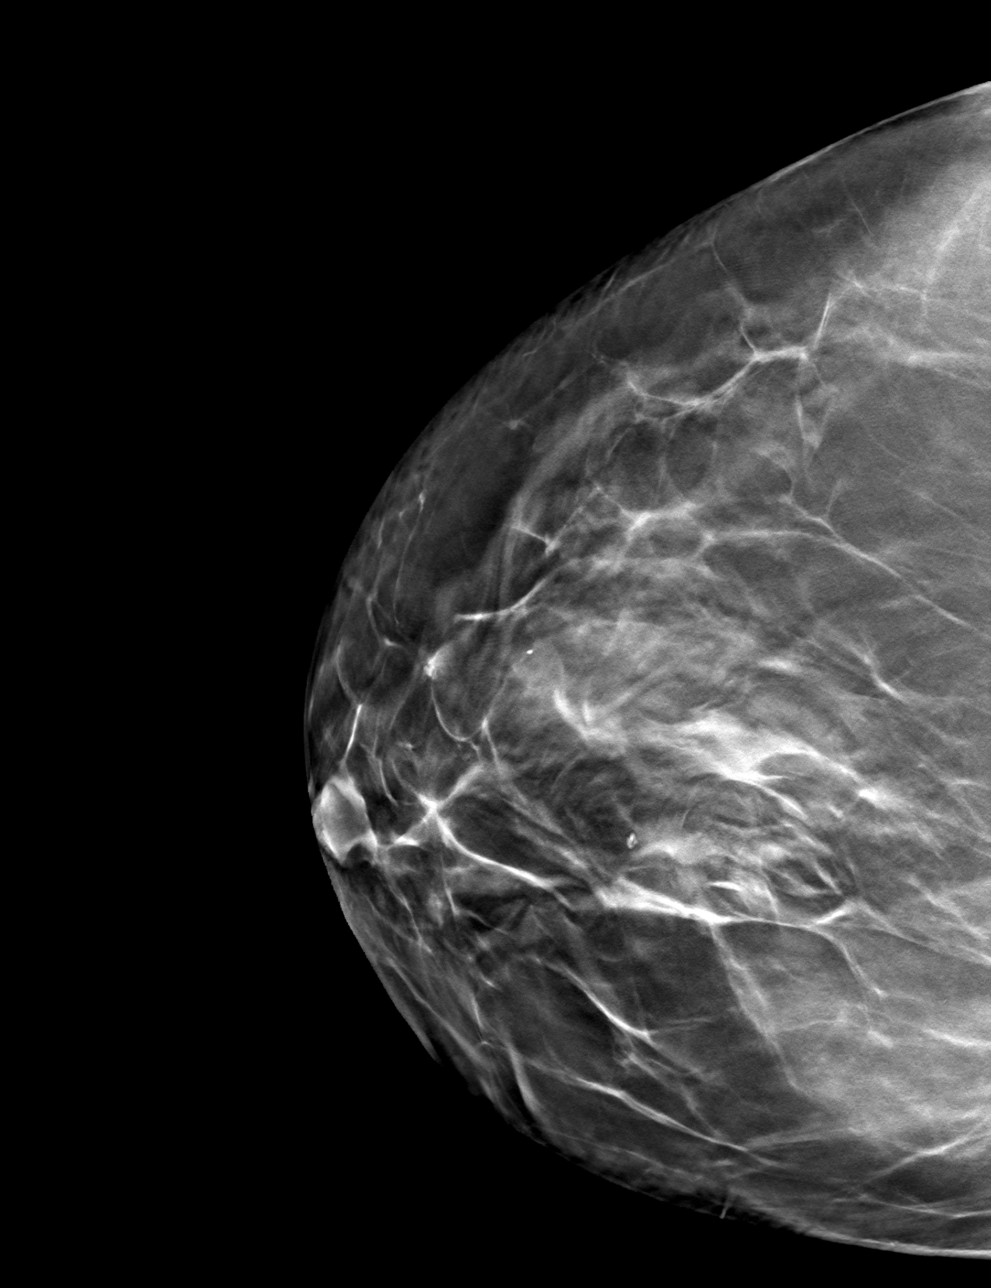

[4 of 12 positions shown; findings below may reference images not displayed]

FINDINGS: 3D Mammographic images were obtained following MRI guided biopsy of
right breast mass. The biopsy marking clip is in expected position
at the site of biopsy.
IMPRESSION: Appropriate positioning of the barbell shaped biopsy marking clip at
the site of biopsy in the medial right breast.

Final Assessment: Post Procedure Mammograms for Marker Placement

## 2021-04-06 MED ORDER — GADOBUTROL 1 MMOL/ML IV SOLN
7.0000 mL | Freq: Once | INTRAVENOUS | Status: AC | PRN
  Administered 2021-04-06: 7 mL via INTRAVENOUS

## 2021-04-11 DIAGNOSIS — C50912 Malignant neoplasm of unspecified site of left female breast: Secondary | ICD-10-CM | POA: Insufficient documentation

## 2021-04-12 ENCOUNTER — Inpatient Hospital Stay (HOSPITAL_COMMUNITY): Payer: BC Managed Care – PPO | Attending: Hematology | Admitting: Hematology

## 2021-04-12 ENCOUNTER — Encounter (HOSPITAL_COMMUNITY): Payer: Self-pay | Admitting: Hematology

## 2021-04-12 DIAGNOSIS — Z5189 Encounter for other specified aftercare: Secondary | ICD-10-CM | POA: Diagnosis not present

## 2021-04-12 DIAGNOSIS — C50912 Malignant neoplasm of unspecified site of left female breast: Secondary | ICD-10-CM | POA: Insufficient documentation

## 2021-04-12 DIAGNOSIS — Z5111 Encounter for antineoplastic chemotherapy: Secondary | ICD-10-CM | POA: Diagnosis present

## 2021-04-12 DIAGNOSIS — Z170 Estrogen receptor positive status [ER+]: Secondary | ICD-10-CM | POA: Diagnosis not present

## 2021-04-12 NOTE — Patient Instructions (Addendum)
[place] at [facility] ?Discharge Instructions ? ?You were seen and examined today by Dr. Delton Coombes. Dr. Delton Coombes is a medical oncologist, meaning that he specializes in the treatment of cancer diagnoses. Dr. Delton Coombes discussed your past medical history, family history of cancers, and the events that led to you being here today. ? ?You were referred to Dr. Delton Coombes due to your recent diagnosis of hormone receptor positive infiltrating lobular carcinoma, this is a common type of breast cancer. During your surgery, there were 2 lymph nodes involved. With the information known now, it is at least a Stage II Breast Cancer. ? ?Dr. Delton Coombes has recommended a PET scan. A PET scan is a specialized CT scan that illuminates where there is cancer present in your body. Dr. Delton Coombes has recommended this because the cancer has spread beyond the breast and to the lymph node, this scan will accurately identify where there cancer has impacted your body as well as confirm the stage of the cancer. ? ?Dr. Delton Coombes has also recommended additional testing (Oncotype DX) on your tumor tissue removed during surgery to see the benefit that you will have from chemotherapy. Regardless, because there are lymph nodes involved, you will benefit from chemotherapy. ? ?The recommended chemotherapy regimen is dose-dense AC. It is a combination of chemotherapy drugs adriamycin and cytoxan. This is given once every 2 weeks for 4 cycles. This will be followed by a one drug regimen given weekly, known as Taxol, for 12 weeks. ? ?Dr. Delton Coombes has also discussed potentially meeting with genetic counselors to see if there is any role for additional genetic testing since you have tested negative for BRCA gene. ? ?Please follow-up with Dr. Delton Coombes as scheduled. ? ? ? ?Thank you for choosing [place] at [facility] to provide your oncology and hematology care.  To afford each patient quality  time with our provider, please arrive at least 15 minutes before your scheduled appointment time.  ? ?If you have a lab appointment with the [facility] please come in thru the Main Entrance and check in at the main information desk. ? ?You need to re-schedule your appointment should you arrive 10 or more minutes late.  We strive to give you quality time with our providers, and arriving late affects you and other patients whose appointments are after yours.  Also, if you no show three or more times for appointments you may be dismissed from the clinic at the providers discretion.     ?Again, thank you for choosing [facility].  Our hope is that these requests will decrease the amount of time that you wait before being seen by our physicians.       ?_____________________________________________________________ ? ?Should you have questions after your visit to [facility], please contact our office at [phone] and follow the prompts.  Our office hours are 8:00 a.m. and 4:30 p.m. Monday - Friday.  Please note that voicemails left after 4:00 p.m. may not be returned until the following business day.  We are closed weekends and major holidays.  You do have access to a nurse 24-7, just call the main number to the clinic [phone] and do not press any options, hold on the line and a nurse will answer the phone.   ? ?For prescription refill requests, have your pharmacy contact our office and allow 72 hours.   ? ?Due to Covid, you will need to wear a mask upon entering the  hospital. If you do not have a mask, a mask will be given to you at the Main Entrance upon arrival. For doctor visits, patients may have 1 support person age 61 or older with them. For treatment visits, patients can not have anyone with them due to social distancing guidelines and our immunocompromised population.  ? ? ? ?

## 2021-04-12 NOTE — Progress Notes (Signed)
START ON PATHWAY REGIMEN - Breast ? ? ?  Cycles 1 through 4: A cycle is every 14 days: ?    Doxorubicin  ?    Cyclophosphamide  ?    Pegfilgrastim-xxxx  ?  Cycles 5 through 16: A cycle is every 7 days: ?    Paclitaxel  ? ?**Always confirm dose/schedule in your pharmacy ordering system** ? ?Patient Characteristics: ?Postoperative without Neoadjuvant Therapy (Pathologic Staging), Invasive Disease, Adjuvant Therapy, HER2 Negative/Unknown/Equivocal, ER Positive, Node Positive, Node Positive (1-3), Genomic Testing Not Performed, Chemotherapy Candidate ?Therapeutic Status: Postoperative without Neoadjuvant Therapy (Pathologic Staging) ?AJCC Grade: G2 ?AJCC N Category: pN1a ?AJCC M Category: cM0 ?ER Status: Positive (+) ?AJCC 8 Stage Grouping: IB ?HER2 Status: Negative (-) ?Oncotype Dx Recurrence Score: Awaiting Test Results ?AJCC T Category: pT2 ?PR Status: Positive (+) ?Adjuvant Therapy Status: No Adjuvant Therapy Received Yet or Changing Initial Adjuvant Regimen due to Tolerance ?Has this patient completed genomic testing<= No - Did Not Order Test  ?Intent of Therapy: ?Curative Intent, Discussed with Patient ?

## 2021-04-12 NOTE — Progress Notes (Signed)
? ?[facility] ?[address] ?[place],  [address] ? ? ?Patient Care Team: ?Jacqualine Code, DO as PCP - General (Family Medicine) ?Derek Jack, MD as Medical Oncologist (Medical Oncology) ?Brien Mates, RN as Oncology Nurse Navigator (Medical Oncology) ? 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 ?HISTORY OF PRESENTING ILLNESS:  ?Debra Vaughn [age] y.o. female is here because of recent diagnosis of stage II T2N1 invasive lobular carcinoma of left breast.  ? ?Today she reports feeling good. She palpated a lump in her left breast in November 2022.  She had a left breast biopsy followed by lumpectomy and SLNB done by Dr. Vilinda Blanks in [place].  She later underwent MRI and further biopsy of the right breast lesion which was found to be benign.  She was tested for BRCA 7 years ago which she reports was negative. Her menses are regular and have been lighter since her IUD was placed. ? 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 Her mother had breast cancer at 26 years old, her paternal grandmother had thyroid cancer, and her paternal aunt had skin cancer.  ? ?In terms of breast cancer risk profile:  ?She menarched at early age of 17  ?She had 2 pregnancies, her first child was born at age 45  ?She received birth control pills for approximately 9 years.  ?She has family history of Breast/GYN/GI cancer ? ?I reviewed her records extensively and collaborated the history with the patient. ? ?SUMMARY OF ONCOLOGIC HISTORY: ?Oncology History  ? No history exists.  ? ? Cancer Staging  ?Invasive lobular carcinoma of left breast in female [facility]) ?Staging form: Breast, AJCC 8th Edition ?- Clinical stage from 04/11/2021: Stage IIA (cT2, cN1, cM0, G2, ER+, PR+, HER2-) - Unsigned ?Histopathologic type: Lobular carcinoma, NOS ?Stage prefix: Initial diagnosis ?Histologic grading system: 3 grade system ? ?MEDICAL  HISTORY:  ?History reviewed. No pertinent past medical history. ? ?SURGICAL HISTORY: ?Past Surgical History:  ?Procedure Laterality Date  ? BREAST BIOPSY Right 04/06/2021  ? ? ?SOCIAL HISTORY: ?Social History  ? ?Socioeconomic History  ? Marital status: Married  ?  Spouse name: Not on file  ? Number of children: Not on file  ? Years of education: Not on file  ? Highest education level: Not on file  ?Occupational History  ? Not on file  ?Tobacco Use  ? Smoking status: Never  ? Smokeless tobacco: Never  ?Vaping Use  ? Vaping Use: Never used  ?Substance and Sexual Activity  ? Alcohol use: Not Currently  ?  Comment: occasionally  ? Drug use: Never  ? Sexual activity: Not Currently  ?Other Topics Concern  ? Not on file  ?Social History Narrative  ? Not on file  ? ?Social Determinants of Health  ? ?Financial Resource Strain: Not on file  ?Food Insecurity: Not on file  ?Transportation Needs: Not on file  ?Physical Activity: Not on file  ?Stress: Not on file  ?Social Connections: Not on file  ?Intimate Partner Violence: Not on file  ? ? ?FAMILY HISTORY: ?History reviewed. No pertinent family history. ? ?ALLERGIES:  is allergic to bupropion. ? ?MEDICATIONS:  ?Current Outpatient Medications  ?Medication Sig Dispense Refill  ? busPIRone (BUSPAR) 7.5 MG tablet Take 7.5 mg by mouth 3 (three) times daily.    ? FLUoxetine (PROZAC) 20 MG capsule Take 60 mg by mouth every morning.    ? levonorgestrel (MIRENA, 52 MG,)  20 MCG/DAY IUD by Intrauterine route.    ? ?No current facility-administered medications for this visit.  ? ? ?REVIEW OF SYSTEMS:   ?Review of Systems  ?Constitutional:  Negative for appetite change and fatigue.  ?Cardiovascular:  Positive for chest pain (4/10  port site).  ?Genitourinary:  Negative for menstrual problem.   ?Neurological:  Positive for headaches.  ?Psychiatric/Behavioral:  Positive for sleep disturbance.   ?All other systems reviewed and are negative. ? ?PHYSICAL EXAMINATION: ?ECOG PERFORMANCE STATUS: 0  - Asymptomatic ? ?Vitals:  ? 04/12/21 0809  ?BP: (!) 142/92  ?Pulse: (!) 105  ?Resp: 18  ?Temp: 98 ?F (36.7 ?C)  ?SpO2: 98%  ? ?Filed Weights  ? 04/12/21 0809  ?Weight: 167 lb 8.8 oz (76 kg)  ? ?Physical Exam ?Vitals reviewed.  ?Constitutional:   ?   Appearance: Normal appearance.  ?Cardiovascular:  ?   Rate and Rhythm: Normal rate and regular rhythm.  ?   Pulses: Normal pulses.  ?   Heart sounds: Normal heart sounds.  ?Pulmonary:  ?   Effort: Pulmonary effort is normal.  ?   Breath sounds: Normal breath sounds.  ?Chest:  ?Breasts: ?   Right: Skin change (UIQ bruise) present. No inverted nipple or tenderness.  ?   Left: Skin change (UOQ lumpectomy scar) present. No inverted nipple or tenderness.  ?   Comments: R side Port-a-cath ?Abdominal:  ?   Palpations: Abdomen is soft. There is no hepatomegaly, splenomegaly or mass.  ?   Tenderness: There is no abdominal tenderness.  ?Musculoskeletal:  ?   Right lower leg: No edema.  ?   Left lower leg: No edema.  ?Lymphadenopathy:  ?   Upper Body:  ?   Right upper body: No supraclavicular or axillary adenopathy.  ?   Left upper body: No supraclavicular or axillary adenopathy.  ?Neurological:  ?   General: No focal deficit present.  ?   Mental Status: She is alert and oriented to person, place, and time.  ?Psychiatric:     ?   Mood and Affect: Mood normal.     ?   Behavior: Behavior normal.  ? ? ?Breast Exam Chaperone: Thana Ates   ? ?LABORATORY DATA:  ?I have reviewed the data as listed ?No results found for this or any previous visit (from the past 2160 hour(s)). ? ?RADIOGRAPHIC STUDIES: ?I have personally reviewed the radiological reports and agreed with the findings in the report. ?MR BREAST BILATERAL W WO CONTRAST INC CAD ? ?Addendum Date: 04/02/2021   ?ADDENDUM REPORT: 04/02/2021 10:23 ADDENDUM: This is an addendum to an MRI performed on 03/30/2021. There is a mildly prominent left subpectoral lymph node measuring 8 mm (series 6 image 54). It is slightly less prominent  than the MRI dated 02/28/2021 and may be reactive. Further evaluation with ultrasound is recommended. The patient will be recalled for a solitary prominent left axillary lymph node. At that time sonographic evaluation of the mildly prominent left subpectoral lymph node is also recommended. Electronically Signed   By: Lillia Mountain M.D.   On: 04/02/2021 10:23  ? ?Result Date: 04/02/2021 ?CLINICAL DATA:  46 year old female with history of lumpectomy for left breast infiltrating lobular carcinoma with metastatic axillary adenopathy. Patient complains of left breast pain. Patient had an MRI of the breast on 02/28/2021 at [facility] in [place], [place] which showed an determinate right breast mass. Sonographic evaluation the right breast was performed on 03/06/2021. The mass seen on MRI was not seen sonographically. MR  guided core biopsy recommended. EXAM: BILATERAL BREAST MRI WITH AND WITHOUT CONTRAST TECHNIQUE: Multiplanar, multisequence MR images of both breasts were obtained prior to and following the intravenous administration of 7 ml of Gadavist Three-dimensional MR images were rendered by post-processing of the original MR data on an independent workstation. The three-dimensional MR images were interpreted, and findings are reported in the following complete MRI report for this study. Three dimensional images were evaluated at the independent interpreting workstation using the DynaCAD thin client. COMPARISON:  Previous exam(s). FINDINGS: Breast composition: b. Scattered fibroglandular tissue. Background parenchymal enhancement: Moderate. Right breast: There is a 5 mm enhancing mass in the anterior third of the upper-inner quadrant of the right breast (series 6, image 85). No additional enhancing mass seen in the right breast. Left breast: There are lumpectomy changes with a large seroma in the upper-outer quadrant of the left breast. No suspicious enhancement in the left breast. Lymph nodes: There is a mildly  prominent left axillary lymph node (series 6, image 103) with cortical thickening measuring up to 4 mm. Ancillary findings:  None. IMPRESSION: Indeterminate 5 mm mass in the upper-inner quadrant of the r

## 2021-04-17 ENCOUNTER — Telehealth: Payer: Self-pay | Admitting: Radiology

## 2021-04-17 NOTE — Telephone Encounter (Addendum)
[id number] - TREATMENT OF REFRACTORY NAUSEA  ? ?04/17/2021     1:08PM ? ?PHONE CALL:  Debra Vaughn [phone] to introduce the above mentioned research study. Patient was on her way to another appointment and stated she would return my call once she is done with her appointment. This research clinical coordinator gave patient a phone number to return call. Thanked patient for her time.  ? ?Carol Ada, RT(R)(T) ?Clinical Research Coordinator  ? ?04/18/2021      8:30AM ? ?PHONE CALL: Debra Vaughn [phone] to discuss the above mentioned research study. Patient was on her way to work and stated she would call back this afternoon. Thanked patient for her time. ? ?Carol Ada, RT(R)(T) ?Clinical Research Coordinator  ?

## 2021-04-18 NOTE — Telephone Encounter (Addendum)
[id number] - TREATMENT OF REFRACTORY NAUSEA  ? ?04/18/2021    3:30PM ? ?PHONE CALL: Debra Vaughn [phone] returned the call to discuss the above mentioned study. Gave patient an overview of study, including the voluntary nature. Informed patient this clinical research coordinator would coordinate all visits with her at [facility]. Patient asked what standard anti-nausea medications were given to patients and if this study is in the beginning phases. This coordinator answered all questions. Patient is interested in learning more about the study, so this coordinator confirmed patient's email address. This coordinator sent the consent form and HIPPA form to the patient. This coordinator will follow up with patient on Friday to check in on any further questions/concerns and/or potential interest. Patient was thanked for her time and the opportunity to speak with her about the above mentioned study.  ? ?Carol Ada, RT(R)(T) ?Clinical Research Coordinator ? ?04/20/2021    11:00AM ? ?PHONE CALL: Debra Vaughn [phone] to follow up about any questions/concerns for the above mentioned study. Patient was busy at the time and stated she would call back if she had any questions. Thanked patient for her time.  ? ?Carol Ada, RT(R)(T) ?Clinical Research Coordinator  ?

## 2021-04-19 ENCOUNTER — Encounter (HOSPITAL_COMMUNITY)
Admission: RE | Admit: 2021-04-19 | Discharge: 2021-04-19 | Disposition: A | Discharge status: Home / Self Care | Payer: BC Managed Care – PPO | Source: Ambulatory Visit | Attending: Hematology | Admitting: Hematology

## 2021-04-19 DIAGNOSIS — C50912 Malignant neoplasm of unspecified site of left female breast: Secondary | ICD-10-CM | POA: Diagnosis not present

## 2021-04-19 IMAGING — PT NM PET TUM IMG INITIAL (PI) SKULL BASE T - THIGH
8 series · 25 of 25 positions shown · non-contrast
Comparison: None.

CLINICAL DATA: Initial treatment strategy for invasive lobular
carcinoma of the left breast. Left lumpectomy in [DATE].

EXAM:
NUCLEAR MEDICINE PET SKULL BASE TO THIGH
TECHNIQUE: 8.65 mCi F-18 FDG was injected intravenously. Full-ring PET imaging
was performed from the skull base to thigh after the radiotracer. CT
data was obtained and used for attenuation correction and anatomic
localization.
Fasting blood glucose: 101 mg/dl

[Series 3: ctac · axial · 3.0mm · 0.98mm/px · z∈[-980,-104]mm · 4 of 293 slices shown]
[im 1/293]
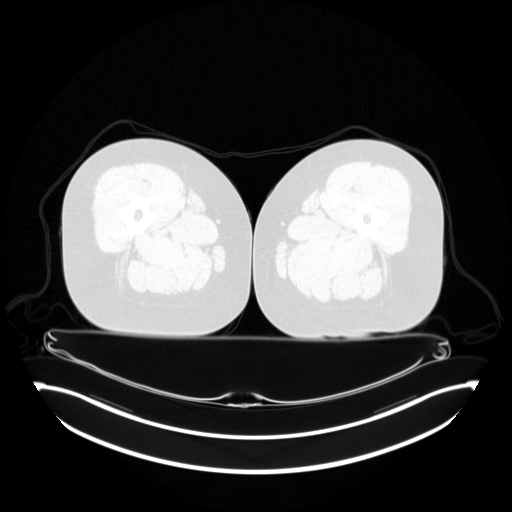
[im 98/293]
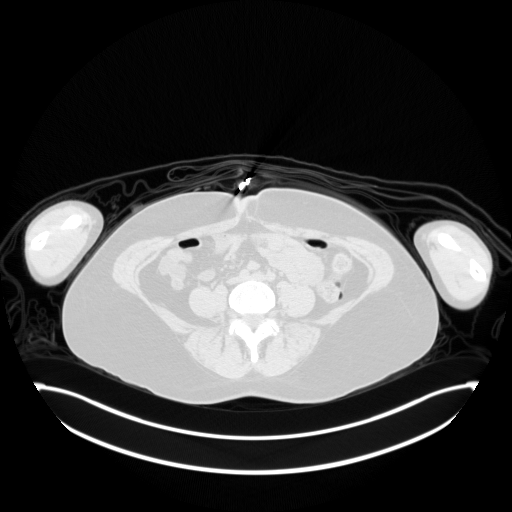
[im 195/293]
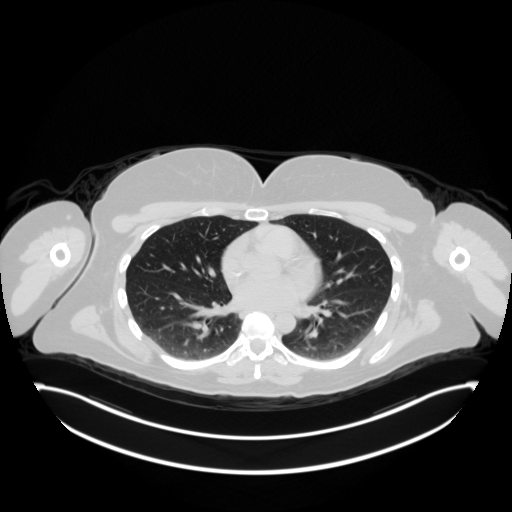
[im 293/293  brain]
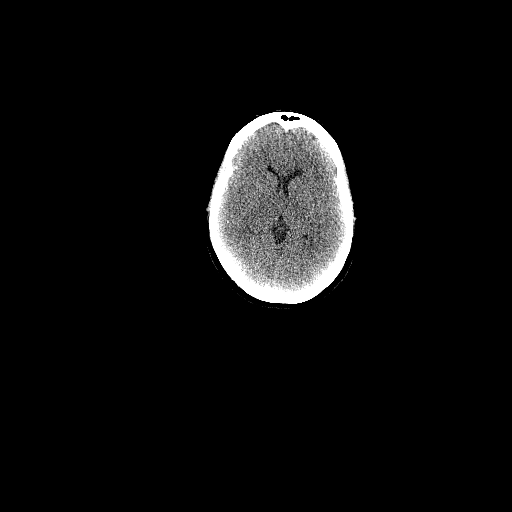

[Series 4: pet ac · axial · 3.0mm · 4.11mm/px · z∈[-980,-104]mm · 4 of 293 slices shown]
[im 1/293]
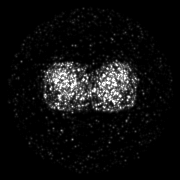
[im 98/293]
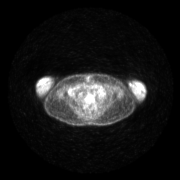
[im 195/293]
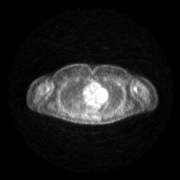
[im 293/293]
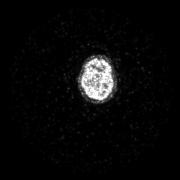

[Series 5: pet nac · axial · 3.0mm · 4.11mm/px · z∈[-980,-104]mm · 5 of 293 slices shown]
[im 1/293]
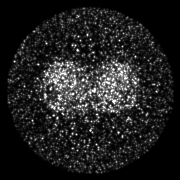
[im 74/293]
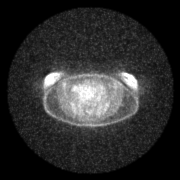
[im 147/293]
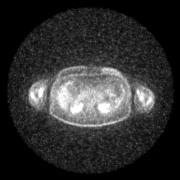
[im 220/293]
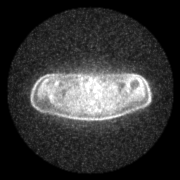
[im 293/293]
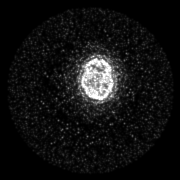

[Series 9: ct lung · axial · 3.0mm · 0.98mm/px · 1 of 91 slices shown]
[im 1/91]
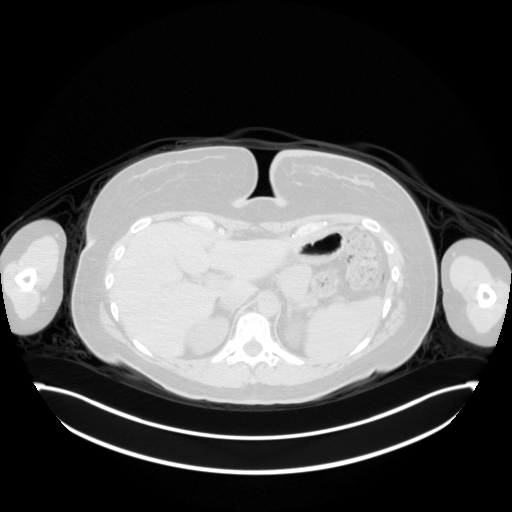

[Series 606: fused tra · 7 of 438 slices shown]
[im 1/438]
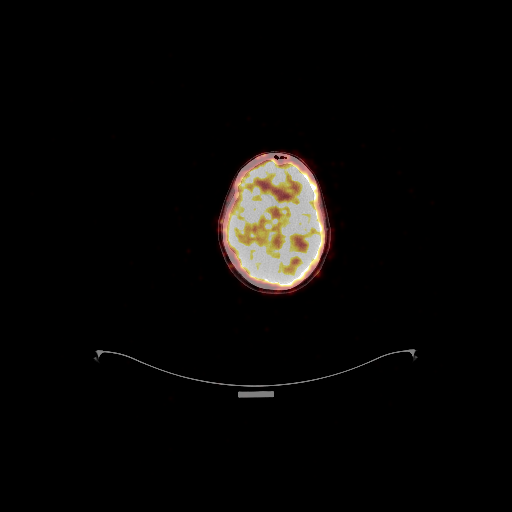
[im 73/438]
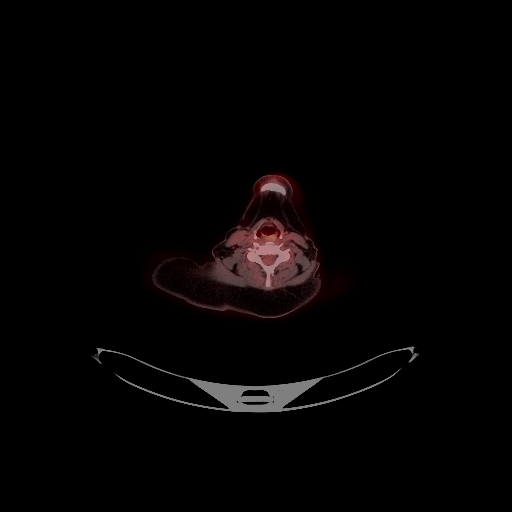
[im 146/438]
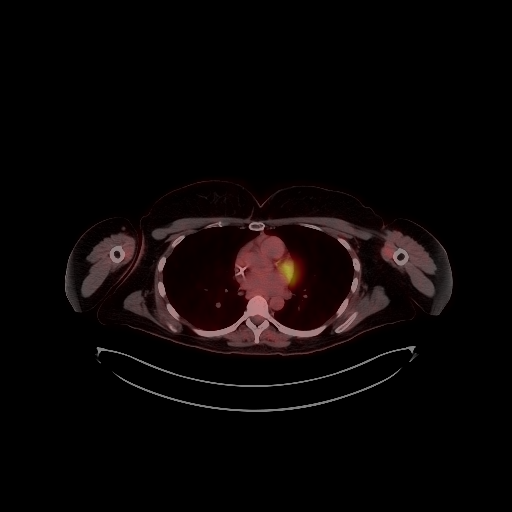
[im 219/438]
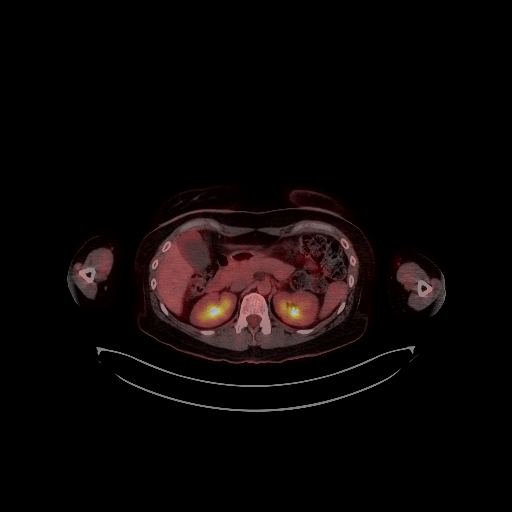
[im 292/438]
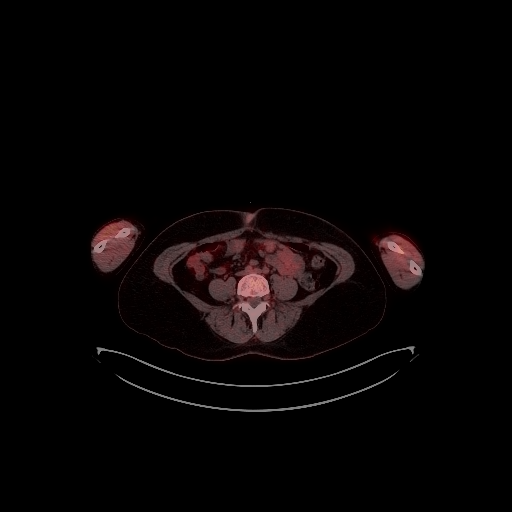
[im 365/438]
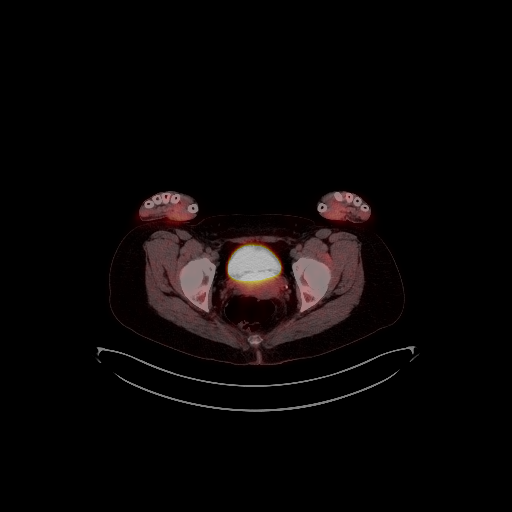
[im 438/438]
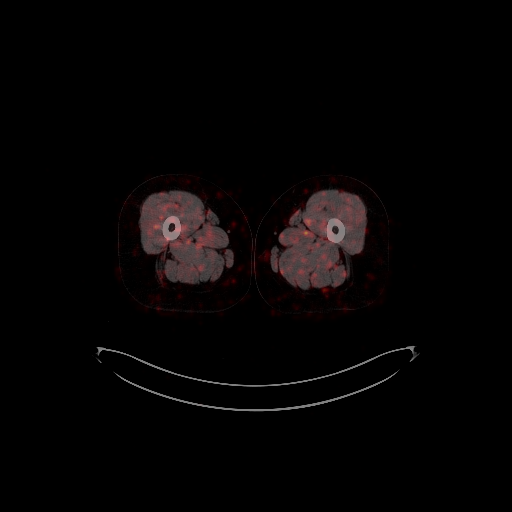

[Series 608: fused cor · 2 of 140 slices shown]
[im 1/140]
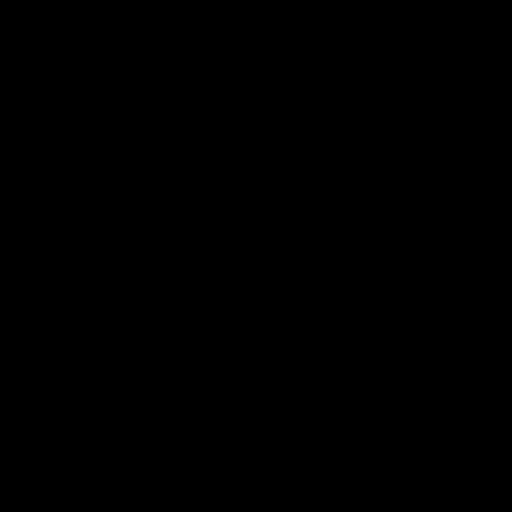
[im 140/140]
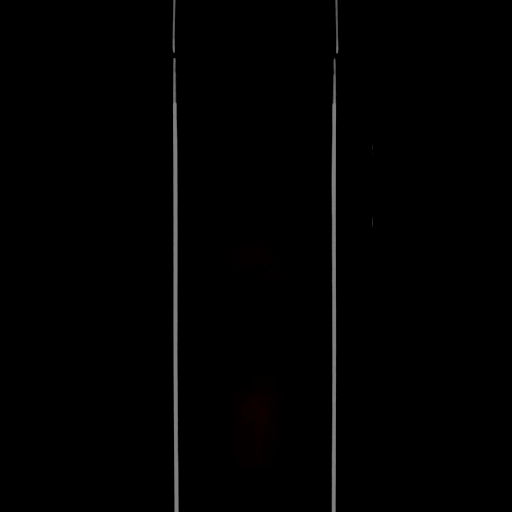

[Series 609: mip cine · coronal · 1.82mm/px · 1 of 48 slices shown]
[im 1/48]
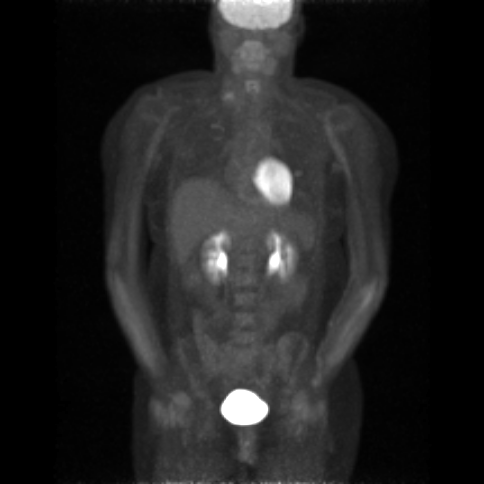

[Series 1036: results mm oncology reading · 1.0mm · 0.93mm/px · 1 of 3 slices shown]
[im 1/3]
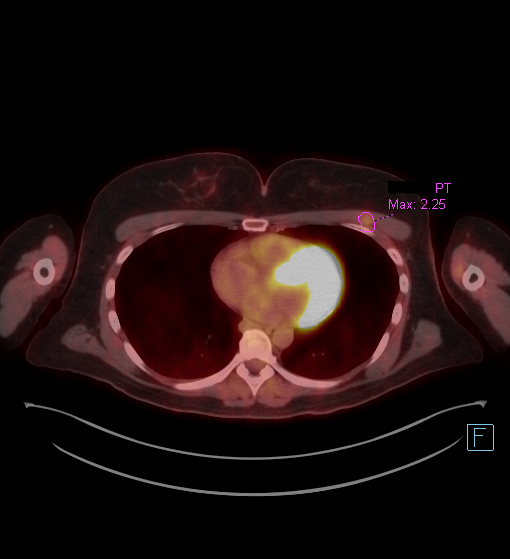

[25 of 25 positions shown; findings below may reference images not displayed]

FINDINGS: Mediastinal blood pool activity: SUV max

NECK:

No hypermetabolic cervical lymph nodes are identified.There are no
lesions of the pharyngeal mucosal space. There is some activity
associated with the patient's right IJ Port-A-Cath.

Incidental CT findings: none

CHEST:

There are no hypermetabolic mediastinal, hilar or axillary lymph
nodes. No hypermetabolic pulmonary activity or suspicious
nodularity. Postsurgical changes in the left breast without
associated suspicious hypermetabolic activity. Postsurgical changes
are also present in the right breast medially, without
hypermetabolic activity. There is faint focal hypermetabolic
activity in the left subpectoral space or overlying pectoralis
muscle (SUV max 2.3) without corresponding focal abnormality on the
CT images.

Incidental CT findings: Right IJ Port-A-Cath extends to the superior
cavoatrial junction. Mild coronary artery atherosclerosis.

ABDOMEN/PELVIS:

There is no hypermetabolic activity within the liver, adrenal
glands, spleen or pancreas. There is no hypermetabolic nodal
activity.

Incidental CT findings: Mild aortoiliac atherosclerosis.
Intrauterine device in place.

SKELETON:

There is no hypermetabolic activity to suggest osseous metastatic
disease. As above, faint focal activity within the left anterior
chest wall without corresponding CT abnormality, potentially
postsurgical.

Incidental CT findings: none
IMPRESSION: 1. Postsurgical changes in both breasts without suspicious metabolic
activity to suggest residual tumor.
2. Small focus of faint hypermetabolic activity within the left
anterior chest wall involving the pectoralis muscle or subpectoral
space, without corresponding CT finding.
3. No evidence of distant metastatic disease.
4. Coronary and Aortic Atherosclerosis ([3Y]-[3Y]).

## 2021-04-19 MED ORDER — FLUDEOXYGLUCOSE F - 18 (FDG) INJECTION
8.6500 | Freq: Once | INTRAVENOUS | Status: AC | PRN
  Administered 2021-04-19: 8.65 via INTRAVENOUS

## 2021-04-23 NOTE — Patient Instructions (Addendum)
[facility] ?Chemotherapy Teaching ? ? ?You are diagnosed with Stage II invasive lobular carcinoma of the left breast. We will treat you in the clinic every 2 weeks with a combination of chemotherapy drugs. Those drugs are doxorubicin (Adriamycin) and cyclophosphamide (Cytoxan).  You will receive these two drugs for a total of 4 cycles, after which you will come weekly for 12 weeks to the clinic to receive a chemotherapy drug called Taxol.  The intent of treatment is cure and to prevent recurrence of this cancer. You will see the doctor regularly throughout treatment.  We will obtain blood work from you prior to every treatment and monitor your results to make sure it is safe to give your treatment. The doctor monitors your response to treatment by the way you are feeling, your blood work, and by obtaining scans periodically.  There will be wait times while you are here for treatment.  It will take about 30 minutes to 1 hour for your lab work to result.  Then there will be wait times while pharmacy mixes your medications.   ? ? ?Medications you will receive in the clinic prior to your Adriamycin and Cytoxan: ? ? ?Aloxi:  ALOXI is used in adults to help prevent nausea and vomiting that happens with certain chemotherapy drugs.  Aloxi is a long acting medication, and will remain in your system for about two days.  ? ?Emend:  This is an anti-nausea medication that is used with Aloxi to help prevent nausea and vomiting caused by chemotherapy. ? ?Dexamethasone:  This is a steroid given prior to chemotherapy to help prevent allergic reactions; it may also help prevent and control nausea and diarrhea.   ? ? ?Medications you will receive in the clinic prior to your Taxol: ? ?Aloxi:  ALOXI is used in adults to help prevent the nausea and vomiting that happens with certain chemotherapies.  Aloxi is a long acting medication, and will remain in your system for about 2 days.  ? ?Pepcid:  This medication is a  histamine blocker that helps prevent and allergic reaction to your chemotherapy.  ? ?Dexamethasone:  This is a steroid given prior to chemotherapy to help prevent allergic reactions; it may also help prevent and control nausea and diarrhea.  ? ?Benadryl:  This is a histamine blocker (different from the Pepcid) that helps prevent allergic/infusion reactions to your chemotherapy. This medication may cause dizziness/drowsiness.  ? ?Udenyca (Neulasta) - this medication is not chemo but being given because you have had chemo. It is usually given 24-72 hours after the completion of chemotherapy. This medication works by boosting your bone marrow?s supply of white blood cells. White blood cells are what protect our bodies against infection. The medication is given in the form of a subcutaneous injection. It is given in the fatty tissue of your abdomen or in the skin in the back of your arm. It is a short needle. The major side effect of this medication is bone or muscle pain. The drug of choice to relieve or lessen the pain is Aleve or Ibuprofen. If a physician has ever told you not to take Aleve or Ibuprofen - then don?t take it. You should then take Tylenol/acetaminophen. Take either medication as the bottle directs you to.  The level of pain you experience as a result of this injection can range from none, to mild or moderate, or severe. Please let us know if you develop moderate or severe bone pain.   You can  take Claritin 10 mg over the counter for a few days after receiving neulasta to help with the bone aches and pains. ? ?Cyclophosphamide (Generic Name) ?Other Names: Cytoxan?, Neosar? ? ?About This Drug ?Cyclophosphamide is a drug used to treat cancer. It is given in the vein (IV) or by mouth.  Takes 30 minutes for this drug to infuse. ? ?Possible Side Effects (More Common) ? Nausea and throwing up (vomiting). These symptoms may happen within a few hours after your treatment and may last up to 72 hours. Medicines  are available to stop or lessen these side effects. ? Bone marrow depression. This is a decrease in the number of white blood cells, red blood cells, and platelets. This may raise your risk of infection, make you tired and weak (fatigue), and raise your risk of bleeding. ? Hair loss: You may notice hair getting thin. Some patients lose their hair. Hair loss is often complete scalp hair loss and can involve loss of eyebrows, eyelashes, and pubic hair. You may notice this a few days or weeks after treatment has started. Most often hair loss is temporary; your hair should grow back when treatment is done. ? Decreased appetite (decreased hunger) ? Blurred vision ? Soreness of the mouth and throat. You may have red areas, white patches, or sores that hurt. ? Effects on the bladder. This drug may cause irritation and bleeding in the bladder. You may have blood in your urine. To help stop this, you will get extra fluids to help you pass more urine. You may get a drug called mesna, which helps to decrease irritation and bleeding. You may also get a medicine to help you pass more urine. You may have a catheter (tube) placed in your bladder so that your bladder will be washed with this drug. ? ?Possible Side Effects (Less Common) ? Darkening of the skin or nails ? Metallic taste in the mouth ? Changes in lung tissue may happen with large amounts of this drug. These changes may not last forever, and your lung tissue may go back to normal. Sometimes these changes may not be seen for many years. You may get a cough or have trouble catching your breath. ? ?Allergic Reactions   ?Serious allergic reactions including anaphylaxis are rare. While you are getting this drug in your vein (IV), tell your nurse right away if you have any of these symptoms of an allergic reaction: ? Trouble catching your breath ? Feeling like your tongue or throat are swelling ? Feeling your heart beat quickly or in a not normal way (palpitations) ? Feeling  dizzy or lightheaded ? Flushing, itching, rash, and/or hives ? ?Treating Side Effects ? Drink 6-8 cups of fluids each day unless your doctor has told you to limit your fluid intake due to some other health problem. A cup is 8 ounces of fluid. If you throw up or have loose bowel movements you should drink more fluids so that you do not become dehydrated (lack water in the body due to losing too much fluid). ? Ask your doctor or nurse about medicine that is available to help stop or lessen nausea or throwing up. ? Mouth care is very important. Your mouth care should consist of routine, gentle cleaning of your teeth or dentures and rinsing your mouth with a mixture of 1/2 teaspoon of salt in 8 ounces of water ?or ? teaspoon of baking soda in 8 ounces of water. This should be done at least after each meal  and at bedtime. ? If you have mouth sores, avoid mouthwash that has alcohol. Also avoid alcohol and smoking because they can bother your mouth and throat. ? Talk with your nurse about getting a wig before you lose your hair. Also, call the [facility] at 800-ACS-2345 to find out information about the ? Look Good.Marland KitchenMarland KitchenFeel Better? program close ?to where you live. It is a free program where women undergoing chemotherapy learn about wigs, turbans and scarves as well as makeup techniques and skin and nail care. ? ?Important Information ? Whenever you tell a doctor or nurse your health history, always tell them that you have received cyclophosphamide in the past. ? If you take this drug by mouth swallow the medicine whole. Do not chew, break or crush it. ? You can take the medicine with or without food. If you have nausea, take it with food. Do not take the pills at bedtime. ? ?Food and Drug Interactions ?There are no known interactions of cyclophosphamide with food. This drug may interact with other medicines. Tell your doctor and pharmacist about all the medicines and dietary supplements (vitamins, minerals,  herbs and others) that you are taking at this time. The safety and use of dietary supplements and alternative diets are often not known. Using these might affect your cancer or interfere with your treatment. Unt

## 2021-04-24 ENCOUNTER — Inpatient Hospital Stay (HOSPITAL_COMMUNITY): Payer: BC Managed Care – PPO

## 2021-04-24 ENCOUNTER — Other Ambulatory Visit (HOSPITAL_COMMUNITY): Payer: BC Managed Care – PPO

## 2021-04-24 ENCOUNTER — Encounter (HOSPITAL_COMMUNITY): Payer: Self-pay | Admitting: Licensed Clinical Social Worker

## 2021-04-24 ENCOUNTER — Encounter: Payer: Self-pay | Admitting: Radiology

## 2021-04-24 DIAGNOSIS — C50912 Malignant neoplasm of unspecified site of left female breast: Secondary | ICD-10-CM

## 2021-04-24 MED ORDER — LIDOCAINE-PRILOCAINE 2.5-2.5 % EX CREA: TOPICAL_CREAM | CUTANEOUS | 3 refills | Status: DC

## 2021-04-24 MED ORDER — PROCHLORPERAZINE MALEATE 10 MG PO TABS: 10.0000 mg | ORAL_TABLET | Freq: Four times a day (QID) | ORAL | 3 refills | Status: DC | PRN

## 2021-04-24 NOTE — Progress Notes (Signed)

## 2021-04-24 NOTE — Research (Signed)
[id number] - TREATMENT OF REFRACTORY NAUSEA  ? ?04/24/2021     3:15PM ? ?PHONE CALL: Spoke with Debra Vaughn via phone after her chemo education. Patient is interested in participating in study. Plan is to meet patient prior to her first infusion to complete consent, registration, baseline questionnaires, and blood collection in accordance to protocol. Thanked patient for her time.  ? ?Carol Ada, RT(R)(T) ?Clinical Research Coordinator ? ?

## 2021-04-24 NOTE — Progress Notes (Signed)
[place] Clinical Social Work  ?Initial Assessment ? ? ?Kieley E Redinger is a 46 y.o. year old female accompanied by patient and husband. Clinical Social Work was referred by nurse navigator for assessment of psychosocial needs.  ? ?SDOH (Social Determinants of Health) assessments performed: Yes ?  ?Distress Screen completed: No ?   ? View : No data to display.  ?  ?  ?  ? ? ? ? ?Family/Social Information:  ?Housing Arrangement: patient lives with spouse Fara Olden) and their 2 sons who are ages 26 and 37  ?Family members/support persons in your life? Family ?Transportation concerns: no  ?Employment: Working full time. Income source: Employment ?Financial concerns: No ?Type of concern: None ?Food access concerns: no ?Religious or spiritual practice: not discussed ?Services Currently in place:  none ? ?Coping/ Adjustment to diagnosis: ?Patient understands treatment plan and what happens next? yes ?Concerns about diagnosis and/or treatment:  Primary concern is how the two children will cope. ?Patient reported stressors: Adjusting to my illness ?Hopes and priorities: Priority is to begin treatment w/ the hope of a positive outcome ?Patient enjoys time with family/ friends ?Current coping skills/ strengths: Supportive family/friends  ? ? ? SUMMARY: ?Current SDOH Barriers:  ?No barriers identified at this time ? ?Clinical Social Work Clinical Goal(s):  ?Patient will reach out to pt should any needs arise, at present no goals identified ? ?Interventions: ?Discussed common feeling and emotions when being diagnosed with cancer, and the importance of support during treatment ?Informed patient of the support team roles and support services at [facility] ?Provided CSW contact information and encouraged patient to call with any questions or concerns ?Provided pt w/ flyer detailing supportive services available as well as information on the [employer] night should pt feel this may be beneficial for the children.  Pt/spouse encouraged to reach out to  the school counselor as well to inform of current situation, which they have already done as pt's spouse is a school Product manager. ? ? ?Follow Up Plan: Patient will contact CSW with any support or resource needs ?Patient verbalizes understanding of plan: Yes ? ? ? ?Henriette Combs, LCSW ?

## 2021-04-25 ENCOUNTER — Ambulatory Visit (HOSPITAL_COMMUNITY): Payer: BC Managed Care – PPO

## 2021-04-25 ENCOUNTER — Other Ambulatory Visit (HOSPITAL_COMMUNITY): Payer: BC Managed Care – PPO | Admitting: Licensed Clinical Social Worker

## 2021-04-25 ENCOUNTER — Ambulatory Visit (HOSPITAL_COMMUNITY): Payer: BC Managed Care – PPO | Admitting: Hematology

## 2021-04-26 ENCOUNTER — Encounter: Payer: Self-pay | Admitting: Radiology

## 2021-04-26 ENCOUNTER — Other Ambulatory Visit (HOSPITAL_COMMUNITY): Payer: BC Managed Care – PPO

## 2021-04-26 DIAGNOSIS — C50912 Malignant neoplasm of unspecified site of left female breast: Secondary | ICD-10-CM

## 2021-04-26 NOTE — Research (Signed)
[id number] - TREATMENT OF REFRACTORY NAUSEA  ? ?04/26/2021       ? ?INITIAL ELIGIBILITY REVIEW: Initial review completed on 04/19/2021 by this clinical research coordinator. A re-review was completed 04/26/21. ? ?This Coordinator has reviewed this patient's inclusion and exclusion criteria and confirmed Debra Vaughn is eligible for study participation.  Patient will continue with enrollment. ? ?Menopausal status (women only): Debra Vaughn is pre-menopausal.  A pregnancy test will be performed.  ? ?Eligibility will be confirmed by second reviewer, RN and treating investigator.  ? ?Carol Ada, RT(R)(T) ?Clinical Research Coordinator ? ? ? ?

## 2021-04-27 ENCOUNTER — Ambulatory Visit (HOSPITAL_COMMUNITY): Payer: BC Managed Care – PPO

## 2021-04-30 ENCOUNTER — Telehealth: Payer: Self-pay | Admitting: Radiology

## 2021-04-30 NOTE — Telephone Encounter (Signed)
[id number] - TREATMENT OF REFRACTORY NAUSEA ? ?04/30/2021         8:50AM ? ?PHONE CALL: Confirmed I was speaking with Candie E Yarbough. Called to inform patient that this coordinator would not be able to see her tomorrow, but Donell Sievert, RN, Clinical Research Nurse would see her instead. Informed patient a urine pregnancy test would be given to confirm pregnancy status prior to consent. Patient confirmed understanding. Thanked patient for her time and continued support in the above mentioned study.  ? ?Carol Ada, RT(R)(T) ?Clinical Research Coordinator ? ?

## 2021-04-30 NOTE — Progress Notes (Signed)
Pharmacist Chemotherapy Monitoring - Initial Assessment   ? ?Anticipated start date: 05/01/21  ? ?The following has been reviewed per standard work regarding the patient's treatment regimen: ?The patient's diagnosis, treatment plan and drug doses, and organ/hematologic function ?Lab orders and baseline tests specific to treatment regimen  ?The treatment plan start date, drug sequencing, and pre-medications ?Prior authorization status  ?Patient's documented medication list, including drug-drug interaction screen and prescriptions for anti-emetics and supportive care specific to the treatment regimen ?The drug concentrations, fluid compatibility, administration routes, and timing of the medications to be used ?The patient's access for treatment and lifetime cumulative dose history, if applicable  ?The patient's medication allergies and previous infusion related reactions, if applicable  ? ?Changes made to treatment plan:  ?N/A ? ?Follow up needed:  ?N/A ? ? ?Wynona Neat, [facility], ?04/30/2021  3:19 PM ? ?

## 2021-05-01 ENCOUNTER — Inpatient Hospital Stay (HOSPITAL_COMMUNITY): Payer: BC Managed Care – PPO

## 2021-05-01 ENCOUNTER — Encounter: Payer: Self-pay | Admitting: Emergency Medicine

## 2021-05-01 ENCOUNTER — Other Ambulatory Visit: Payer: Self-pay

## 2021-05-01 ENCOUNTER — Inpatient Hospital Stay (HOSPITAL_BASED_OUTPATIENT_CLINIC_OR_DEPARTMENT_OTHER): Payer: BC Managed Care – PPO | Admitting: Hematology

## 2021-05-01 VITALS — BP 142/84 | HR 106 | Temp 96.1°F | Resp 20 | Ht 68.0 in | Wt 169.1 lb

## 2021-05-01 VITALS — BP 111/73 | HR 88 | Temp 97.3°F | Resp 18

## 2021-05-01 DIAGNOSIS — C50912 Malignant neoplasm of unspecified site of left female breast: Secondary | ICD-10-CM

## 2021-05-01 DIAGNOSIS — Z5111 Encounter for antineoplastic chemotherapy: Secondary | ICD-10-CM | POA: Diagnosis not present

## 2021-05-01 LAB — CBC WITH DIFFERENTIAL/PLATELET
Abs Immature Granulocytes: 0.07 10*3/uL (ref 0.00–0.07)
Basophils Absolute: 0.1 10*3/uL (ref 0.0–0.1)
Basophils Relative: 1 %
Eosinophils Absolute: 0.2 10*3/uL (ref 0.0–0.5)
Eosinophils Relative: 2 %
HCT: 38.6 % (ref 36.0–46.0)
Hemoglobin: 12.9 g/dL (ref 12.0–15.0)
Immature Granulocytes: 1 %
Lymphocytes Relative: 12 %
Lymphs Abs: 1.3 10*3/uL (ref 0.7–4.0)
MCH: 30.3 pg (ref 26.0–34.0)
MCHC: 33.4 g/dL (ref 30.0–36.0)
MCV: 90.6 fL (ref 80.0–100.0)
Monocytes Absolute: 0.6 10*3/uL (ref 0.1–1.0)
Monocytes Relative: 6 %
Neutro Abs: 8.2 10*3/uL — ABNORMAL HIGH (ref 1.7–7.7)
Neutrophils Relative %: 78 %
Platelets: 302 10*3/uL (ref 150–400)
RBC: 4.26 MIL/uL (ref 3.87–5.11)
RDW: 12.8 % (ref 11.5–15.5)
WBC: 10.4 10*3/uL (ref 4.0–10.5)
nRBC: 0 % (ref 0.0–0.2)

## 2021-05-01 LAB — COMPREHENSIVE METABOLIC PANEL
ALT: 13 U/L (ref 0–44)
AST: 18 U/L (ref 15–41)
Albumin: 3.9 g/dL (ref 3.5–5.0)
Alkaline Phosphatase: 63 U/L (ref 38–126)
Anion gap: 7 (ref 5–15)
BUN: 12 mg/dL (ref 6–20)
CO2: 22 mmol/L (ref 22–32)
Calcium: 9 mg/dL (ref 8.9–10.3)
Chloride: 108 mmol/L (ref 98–111)
Creatinine, Ser: 0.58 mg/dL (ref 0.44–1.00)
GFR, Estimated: >60 mL/min (ref >60)
Glucose, Bld: 105 mg/dL — ABNORMAL HIGH (ref 70–99)
Potassium: 3.9 mmol/L (ref 3.5–5.1)
Sodium: 137 mmol/L (ref 135–145)
Total Bilirubin: 0.6 mg/dL (ref 0.3–1.2)
Total Protein: 7.3 g/dL (ref 6.5–8.1)

## 2021-05-01 LAB — PREGNANCY, URINE: Preg Test, Ur: NEGATIVE

## 2021-05-01 LAB — MAGNESIUM: Magnesium: 2 mg/dL (ref 1.7–2.4)

## 2021-05-01 MED ORDER — SODIUM CHLORIDE 0.9 % IV SOLN
600.0000 mg/m2 | Freq: Once | INTRAVENOUS | Status: AC
  Administered 2021-05-01: 1140 mg via INTRAVENOUS
  Filled 2021-05-01: qty 57

## 2021-05-01 MED ORDER — HEPARIN SOD (PORK) LOCK FLUSH 100 UNIT/ML IV SOLN
500.0000 [IU] | Freq: Once | INTRAVENOUS | Status: AC | PRN
  Administered 2021-05-01: 500 [IU]

## 2021-05-01 MED ORDER — SODIUM CHLORIDE 0.9% FLUSH
10.0000 mL | INTRAVENOUS | Status: DC | PRN
  Administered 2021-05-01: 10 mL

## 2021-05-01 MED ORDER — PEGFILGRASTIM 6 MG/0.6ML ~~LOC~~ PSKT
6.0000 mg | PREFILLED_SYRINGE | Freq: Once | SUBCUTANEOUS | Status: AC
  Administered 2021-05-01: 6 mg via SUBCUTANEOUS
  Filled 2021-05-01: qty 0.6

## 2021-05-01 MED ORDER — DOXORUBICIN HCL CHEMO IV INJECTION 2 MG/ML
60.0000 mg/m2 | Freq: Once | INTRAVENOUS | Status: AC
  Administered 2021-05-01: 114 mg via INTRAVENOUS
  Filled 2021-05-01: qty 57

## 2021-05-01 MED ORDER — ACETAMINOPHEN 325 MG PO TABS
650.0000 mg | ORAL_TABLET | Freq: Once | ORAL | Status: AC
  Administered 2021-05-01: 650 mg via ORAL
  Filled 2021-05-01: qty 2

## 2021-05-01 MED ORDER — SODIUM CHLORIDE 0.9 % IV SOLN: Freq: Once | INTRAVENOUS | Status: AC

## 2021-05-01 MED ORDER — PALONOSETRON HCL INJECTION 0.25 MG/5ML
0.2500 mg | Freq: Once | INTRAVENOUS | Status: AC
  Administered 2021-05-01: 0.25 mg via INTRAVENOUS
  Filled 2021-05-01: qty 5

## 2021-05-01 MED ORDER — SODIUM CHLORIDE 0.9 % IV SOLN
150.0000 mg | Freq: Once | INTRAVENOUS | Status: AC
  Administered 2021-05-01: 150 mg via INTRAVENOUS
  Filled 2021-05-01: qty 5

## 2021-05-01 MED ORDER — SODIUM CHLORIDE 0.9 % IV SOLN
10.0000 mg | Freq: Once | INTRAVENOUS | Status: AC
  Administered 2021-05-01: 10 mg via INTRAVENOUS
  Filled 2021-05-01: qty 1

## 2021-05-01 NOTE — Research (Signed)
[id number] - TREATMENT OF REFRACTORY NAUSEA ? ? ?This Nurse has reviewed this patient's inclusion and exclusion criteria as a second review and confirms Debra Vaughn is eligible for study participation.  Patient may continue with enrollment. ? ?[name]. Irene Mitcham, RN, BSN, CHPN ?She  [facility]  [facility] ?Clinical Research Nurse ?[place] ?Direct Dial [phone]  Pager [phone] ?05/01/2021 9:27 AM ?

## 2021-05-01 NOTE — Progress Notes (Signed)
? ?[facility] ?[address] ?[place], [place] [address] ? ? ?CLINIC:  ?Medical Oncology/Hematology ? ?PCP:  ?Jacqualine Code, DO ?[place] / [place] [id number] ?[phone] ? ? 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 ?PRIOR THERAPY: Left breast lumpectomy and SLNB on 02/01/2021 by Dr. Matt Holmes ? ?NGS Results: not done ? ?CURRENT THERAPY: ADJUVANT DOSE DENSE AC q14d / PACLitaxel q7d ? ?BRIEF ONCOLOGIC HISTORY:  ?Oncology History  ?Invasive lobular carcinoma of left breast in female [facility])  ?04/11/2021 Initial Diagnosis  ? Invasive lobular carcinoma of left breast in female [facility]) ? ?  ?05/01/2021 -  Chemotherapy  ? Patient is on Treatment Plan : BREAST ADJUVANT DOSE DENSE AC q14d / PACLitaxel q7d  ? ?  ?  ? ? ?CANCER STAGING: ? Cancer Staging  ?Invasive lobular carcinoma of left breast in female [facility]) ?Staging form: Breast, AJCC 8th Edition ?- Clinical stage from 04/11/2021: Stage IIA (cT2, cN1, cM0, G2, ER+, PR+, HER2-) - Unsigned ? ? ?INTERVAL HISTORY:  ?Debra Vaughn Debra Vaughn, a 46 y.o. female, returns for routine follow-up and consideration for next cycle of chemotherapy. Jayda was last seen on 04/12/2021. ? ?Due for cycle #1 of AC today.  ? ?Overall, she tells me she has been feeling pretty well. She is nervous to start treatment. She reports she did not experience much nausea during prior pregnancy.  ? ?Overall, she feels ready for next cycle of chemo today.  ? ?REVIEW OF SYSTEMS:  ?Review of Systems  ?Constitutional:  Negative for appetite change and fatigue.  ?Psychiatric/Behavioral:  Positive for depression. The patient is nervous/anxious.   ?All other systems reviewed and are negative. ? ?PAST MEDICAL/SURGICAL HISTORY:  ?No past medical history on file. ?Past Surgical History:  ?Procedure Laterality Date  ? BREAST BIOPSY Right 04/06/2021  ? ? ?SOCIAL HISTORY:  ?Social History  ? ?Socioeconomic History  ? Marital status: Married  ?  Spouse name:  Not on file  ? Number of children: Not on file  ? Years of education: Not on file  ? Highest education level: Not on file  ?Occupational History  ? Not on file  ?Tobacco Use  ? Smoking status: Never  ? Smokeless tobacco: Never  ?Vaping Use  ? Vaping Use: Never used  ?Substance and Sexual Activity  ? Alcohol use: Not Currently  ?  Comment: occasionally  ? Drug use: Never  ? Sexual activity: Not Currently  ?Other Topics Concern  ? Not on file  ?Social History Narrative  ? Not on file  ? ?Social Determinants of Health  ? ?Financial Resource Strain: Not on file  ?Food Insecurity: Not on file  ?Transportation Needs: Not on file  ?Physical Activity: Not on file  ?Stress: Not on file  ?Social Connections: Not on file  ?Intimate Partner Violence: Not on file  ? ? ?FAMILY HISTORY:  ?No family history on file. ? ?CURRENT MEDICATIONS:  ?Current Outpatient Medications  ?Medication Sig Dispense Refill  ? busPIRone (BUSPAR) 7.5 MG tablet Take 7.5 mg by mouth 3 (three) times daily.    ? CYCLOPHOSPHAMIDE IV Inject into the vein every 14 (fourteen) days.    ? DOXORUBICIN HCL IV Inject into the vein every 14 (fourteen) days.    ? FLUoxetine (PROZAC) 20 MG capsule Take 60 mg by mouth every morning.    ? levonorgestrel (MIRENA, 52 MG,) 20 MCG/DAY IUD by Intrauterine route.    ? lidocaine-prilocaine (EMLA) cream  Apply a small amount to port a cath site and cover with plastic wrap 1 hour prior to infusion appointments 30 g 3  ? prochlorperazine (COMPAZINE) 10 MG tablet Take 1 tablet (10 mg total) by mouth every 6 (six) hours as needed (Nausea or vomiting). 60 tablet 3  ? ?No current facility-administered medications for this visit.  ? ? ?ALLERGIES:  ?Allergies  ?Allergen Reactions  ? Bupropion Hives  ?  Other reaction(s): seizure  ? ? ?PHYSICAL EXAM:  ?Performance status (ECOG): 0 - Asymptomatic ? ?There were no vitals filed for this visit. ?Wt Readings from Last 3 Encounters:  ?04/12/21 167 lb 8.8 oz (76 kg)  ? ?Physical Exam ?Vitals  reviewed.  ?Constitutional:   ?   Appearance: Normal appearance.  ?Cardiovascular:  ?   Rate and Rhythm: Normal rate and regular rhythm.  ?   Pulses: Normal pulses.  ?   Heart sounds: Normal heart sounds.  ?Pulmonary:  ?   Effort: Pulmonary effort is normal.  ?   Breath sounds: Normal breath sounds.  ?Neurological:  ?   General: No focal deficit present.  ?   Mental Status: She is alert and oriented to person, place, and time.  ?Psychiatric:     ?   Mood and Affect: Mood normal.     ?   Behavior: Behavior normal.  ? ? ?LABORATORY DATA:  ?I have reviewed the labs as listed.  ?   ? View : No data to display.  ?  ?  ?  ? ?   ? View : No data to display.  ?  ?  ?  ? ? ?DIAGNOSTIC IMAGING:  ?I have independently reviewed the scans and discussed with the patient. ?NM PET Image Initial (PI) Skull Base To Thigh (F-18 FDG) ? ?Result Date: 04/20/2021 ?CLINICAL DATA:  Initial treatment strategy for invasive lobular carcinoma of the left breast. Left lumpectomy in January 2023. EXAM: NUCLEAR MEDICINE PET SKULL BASE TO THIGH TECHNIQUE: 8.65 mCi F-18 FDG was injected intravenously. Full-ring PET imaging was performed from the skull base to thigh after the radiotracer. CT data was obtained and used for attenuation correction and anatomic localization. Fasting blood glucose: 101 mg/dl COMPARISON:  None. FINDINGS: Mediastinal blood pool activity: SUV max 1.7 NECK: No hypermetabolic cervical lymph nodes are identified.There are no lesions of the pharyngeal mucosal space. There is some activity associated with the patient's right IJ Port-A-Cath. Incidental CT findings: none CHEST: There are no hypermetabolic mediastinal, hilar or axillary lymph nodes. No hypermetabolic pulmonary activity or suspicious nodularity. Postsurgical changes in the left breast without associated suspicious hypermetabolic activity. Postsurgical changes are also present in the right breast medially, without hypermetabolic activity. There is faint focal  hypermetabolic activity in the left subpectoral space or overlying pectoralis muscle (SUV max 2.3) without corresponding focal abnormality on the CT images. Incidental CT findings: Right IJ Port-A-Cath extends to the superior cavoatrial junction. Mild coronary artery atherosclerosis. ABDOMEN/PELVIS: There is no hypermetabolic activity within the liver, adrenal glands, spleen or pancreas. There is no hypermetabolic nodal activity. Incidental CT findings: Mild aortoiliac atherosclerosis. Intrauterine device in place. SKELETON: There is no hypermetabolic activity to suggest osseous metastatic disease. As above, faint focal activity within the left anterior chest wall without corresponding CT abnormality, potentially postsurgical. Incidental CT findings: none IMPRESSION: 1. Postsurgical changes in both breasts without suspicious metabolic activity to suggest residual tumor. 2. Small focus of faint hypermetabolic activity within the left anterior chest wall involving the pectoralis muscle or subpectoral  space, without corresponding CT finding. 3. No evidence of distant metastatic disease. 4. Coronary and Aortic Atherosclerosis (ICD10-I70.0). Electronically Signed   By: Richardean Sale M.D.   On: 04/20/2021 14:15  ? ?[place] AXILLARY NODE CORE BIOPSY LEFT ? 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 This was found to be concordant by Dr. Lovey Newcomer. Pathology revealed NODAL TISSUE WITH PIGMENTED HISTIOCYTES- NO MALIGNANCY IDENTIFIED of the LEFT breast intramammary lymph node, lateral, 3 o'clock (Hydromark clip). This was found to be concordant by Dr. Lovey Newcomer. Pathology results were discussed with the patient by telephone. The patient reported doing well after the biopsies with tenderness at the sites. Post biopsy instructions and care were reviewed  and questions were answered. The patient was encouraged to call [facility] for any additional concerns. Patient is scheduled to see Dr. Derek Jack of [facility]

## 2021-05-01 NOTE — Progress Notes (Signed)
Patient presents today for D1C1 Doxorubicin/Cytoxan. Patient had echocardiogram performed in [place], [place] which Dr. Delton Coombes has reviewed, Okay to proceed with treatment per Dr. Delton Coombes. ?Patient finished treatment and reports slight headache 4/10, Dr. Delton Coombes made aware, received orders for 650 mg Tylenol.  ?Patient tolerated chemotherapy with no complaints voiced. Side effects with management reviewed understanding verbalized. Port site clean and dry with no bruising or swelling noted at site. Good blood return noted before and after administration of chemotherapy. Band aid applied.  ?Onpro information reviewed with patient and patient given education. Onpro applied with no alarms noted. Patient left in satisfactory condition with VSS and no s/s of distress noted.  ?

## 2021-05-01 NOTE — Research (Signed)
DCP-001: Use of a Clinical Trial Screening Tool to Address Cancer Health Disparities in the [facility]) ? ?STUDY/CONSENTS INTRO ? ?Patient Debra Vaughn was identified by [place] as a potential candidate for the above listed study.  This Clinical Research Nurse met with [name], [id number], on 05/01/21 in a manner and location that ensures patient privacy to discuss participation in the above listed research study.  Patient is Accompanied by spouse .  A copy of the informed consent document and separate HIPAA Authorization was provided to the patient.  Patient reads, speaks, and understands [place].   ?Patient was provided with the business card of this Nurse and encouraged to contact the research team with any questions.  Approximately 15 minutes were spent with the patient reviewing the informed consent documents.  Patient was provided the option of taking informed consent documents home to review and was encouraged to review at their convenience with their support network, including other care providers. Patient took the consent documents home to review.  Research will f/u with patient in the next few days to determine interest. ? ?Wells Guiles 'Learta Codding' Nassir Neidert, RN, BSN ?Clinical Research Nurse I ?05/01/21 ?10:53 AM ? ?

## 2021-05-01 NOTE — Patient Instructions (Addendum)
[facility]  Discharge Instructions: ?Thank you for choosing [facility] to provide your oncology and hematology care.  ?If you have a lab appointment with the [facility], please come in thru the Main Entrance and check in at the main information desk. ? ?Wear comfortable clothing and clothing appropriate for easy access to any Portacath or PICC line.  ? ?We strive to give you quality time with your provider. You may need to reschedule your appointment if you arrive late (15 or more minutes).  Arriving late affects you and other patients whose appointments are after yours.  Also, if you miss three or more appointments without notifying the office, you may be dismissed from the clinic at the provider?s discretion.    ?  ?For prescription refill requests, have your pharmacy contact our office and allow 72 hours for refills to be completed.   ? ?Today you received the following chemotherapy and/or immunotherapy agents Doxorubicin/Cytoxan, Neulesta Onpro applied with no alarms noted, return as scheduled. ?  ?To help prevent nausea and vomiting after your treatment, we encourage you to take your nausea medication as directed. ? ?BELOW ARE SYMPTOMS THAT SHOULD BE REPORTED IMMEDIATELY: ?*FEVER GREATER THAN 100.4 F (38 ?C) OR HIGHER ?*CHILLS OR SWEATING ?*NAUSEA AND VOMITING THAT IS NOT CONTROLLED WITH YOUR NAUSEA MEDICATION ?*UNUSUAL SHORTNESS OF BREATH ?*UNUSUAL BRUISING OR BLEEDING ?*URINARY PROBLEMS (pain or burning when urinating, or frequent urination) ?*BOWEL PROBLEMS (unusual diarrhea, constipation, pain near the anus) ?TENDERNESS IN MOUTH AND THROAT WITH OR WITHOUT PRESENCE OF ULCERS (sore throat, sores in mouth, or a toothache) ?UNUSUAL RASH, SWELLING OR PAIN  ?UNUSUAL VAGINAL DISCHARGE OR ITCHING  ? ?Items with * indicate a potential emergency and should be followed up as soon as possible or go to the Emergency Department if any problems should occur. ? ?Please show the CHEMOTHERAPY  ALERT CARD or IMMUNOTHERAPY ALERT CARD at check-in to the Emergency Department and triage nurse. ? ?Should you have questions after your visit or need to cancel or reschedule your appointment, please contact [facility] [phone]  and follow the prompts.  Office hours are 8:00 a.m. to 4:30 p.m. Monday - Friday. Please note that voicemails left after 4:00 p.m. may not be returned until the following business day.  We are closed weekends and major holidays. You have access to a nurse at all times for urgent questions. Please call the main number to the clinic [phone] and follow the prompts. ? ?For any non-urgent questions, you may also contact your provider using MyChart. We now offer e-Visits for anyone 62 and older to request care online for non-urgent symptoms. For details visit [email or web address]. ?  ?Also download the MyChart app! Go to the app store, search "MyChart", open the app, select [facility], and log in with your MyChart username and password. ? ?Due to Covid, a mask is required upon entering the hospital/clinic. If you do not have a mask, one will be given to you upon arrival. For doctor visits, patients may have 1 support person aged 46 or older with them. For treatment visits, patients cannot have anyone with them due to current Covid guidelines and our immunocompromised population.  ?

## 2021-05-01 NOTE — Progress Notes (Signed)
Patients port flushed without difficulty.  Good blood return noted with no bruising or swelling noted at site.  Stable during access and blood draw.  Patient to remain accessed for treatment. 

## 2021-05-01 NOTE — Research (Signed)
[id number] - TREATMENT OF REFRACTORY NAUSEA ? ?ELIGIBILITY CHECK ? ?This Nurse has reviewed this patient's inclusion and exclusion criteria and confirmed Shantae E Gaber is eligible for study participation.  Patient will continue with enrollment. ? ?Menopausal status (women only): Aicia E Wilhelmi is pre- menopausal and patient is not currently sexually active.  Negative pregnancy test today.  Pt instructed about importance of contraception/pregnancy prevention during study, verbalized understanding. ? ?Eligibility confirmed by treating investigator, who also agrees that patient should proceed with enrollment. ? ?Reviewed blood glucose level of 105 today with MD Delton Coombes, value is ruled as not uncontrolled hyperglycemia and patient is to continue with planned treatment today.  Pt denies history of hyperglycemia and DM, states she did not fast before labs this am. ? ?Reviewed all exclusion criteria with patient who verbally denied all.  Pt confirms she has not had any prior cancer treatment. ? ?Pt confirms she speaks, reads, and understands [place]. ? ?Wells Guiles 'Learta Codding' Odean Fester, RN, BSN ?Clinical Research Nurse I ?05/01/21 ?10:53 AM ? ?

## 2021-05-01 NOTE — Research (Signed)
[id number] - TREATMENT OF REFRACTORY NAUSEA ? ?REGISTRATION ? ?Patient Debra Vaughn, was registered to the above listed study by Clinical Research Nurse [facility], assigned study [phone]. Randomization is required for this study at Cycle 2. Randomization information to follow at Cycle 2. ? ?Wells Guiles 'Learta Codding' Neysa Bonito, RN, BSN ?Clinical Research Nurse I ?05/01/21 ?10:54 AM ? ?

## 2021-05-01 NOTE — Patient Instructions (Addendum)
[facility] at [facility] ?Discharge Instructions ? ?You were seen and examined today by Dr. Delton Coombes. ? ?Dr. Delton Coombes discussed your most recent lab work and PET scan which confirmed that you have Stage II Breast Cancer. There is no spread to other organs within the body. This is good news. ? ?Proceed with treatment today. You will check in next week with the PA at the [facility], to see how you are doing after your first cycle of chemotherapy. ? ?Please call the [facility] with questions or concerns. ? ? ?Thank you for choosing [facility] at [facility] to provide your oncology and hematology care.  To afford each patient quality time with our provider, please arrive at least 15 minutes before your scheduled appointment time.  ? ?If you have a lab appointment with the [facility] please come in thru the Main Entrance and check in at the main information desk. ? ?You need to re-schedule your appointment should you arrive 10 or more minutes late.  We strive to give you quality time with our providers, and arriving late affects you and other patients whose appointments are after yours.  Also, if you no show three or more times for appointments you may be dismissed from the clinic at the providers discretion.     ?Again, thank you for choosing [facility].  Our hope is that these requests will decrease the amount of time that you wait before being seen by our physicians.       ?_____________________________________________________________ ? ?Should you have questions after your visit to [facility], please contact our office at [phone] and follow the prompts.  Our office hours are 8:00 a.m. and 4:30 p.m. Monday - Friday.  Please note that voicemails left after 4:00 p.m. may not be returned until the following business day.  We are closed weekends and major holidays.  You do have access to a nurse 24-7, just call the  main number to the clinic [phone] and do not press any options, hold on the line and a nurse will answer the phone.   ? ?For prescription refill requests, have your pharmacy contact our office and allow 72 hours.   ? ?Due to Covid, you will need to wear a mask upon entering the hospital. If you do not have a mask, a mask will be given to you at the Main Entrance upon arrival. For doctor visits, patients may have 1 support person age 74 or older with them. For treatment visits, patients can not have anyone with them due to social distancing guidelines and our immunocompromised population.  ? ? ? ?

## 2021-05-01 NOTE — Research (Signed)
Trial Name:  [id number] - TREATMENT OF REFRACTORY NAUSEA ? ?STUDY CONSENTS ? ?Patient Debra Vaughn was identified by MD Katragadda/CRC Carol Ada as a potential candidate for the above listed study.  This Clinical Research Nurse met with Debra Vaughn, Debra Vaughn on 05/01/21 in a manner and location that ensures patient privacy to discuss participation in the above listed research study.  Patient is Accompanied by spouse .  Patient was previously provided with informed consent documents.  Patient confirmed they have read the informed consent documents. ? ?As outlined in the informed consent form, this Nurse and Debra Vaughn discussed the purpose of the research study, the investigational nature of the study, study procedures and requirements for study participation, potential risks and benefits of study participation, as well as alternatives to participation.  This study is blinded or double-blinded (cycle 2). The patient understands participation is voluntary and they may withdraw from study participation at any time.  Each study arm was reviewed, and randomization discussed.(Cycle 2) Potential side effects were reviewed with patient as outlined in the consent form, and patient made aware there may be side effects not yet known. (Cycle 2) The chance of receiving placebo was discussed. (Cycle 2) Patient understands enrollment is pending full eligibility review.  ? ?Confidentiality and how the patient's information will be used as part of study participation were discussed.  Patient was informed there is not reimbursement provided for their time and effort spent on trial participation.  The patient is encouraged to discuss research study participation with their insurance provider to determine what costs they may incur as part of study participation, including research related injury.   ? ?All questions were answered to patient's satisfaction.  The informed consent and separate HIPAA Authorization was reviewed page by  page.  The patient's mental and emotional status is appropriate to provide informed consent, and the patient verbalizes an understanding of study participation.  Patient has agreed to participate in the above listed research study and has voluntarily signed the informed consent version 01/10/21 ([facility] date 02/07/21) and separate HIPAA Authorization, version date 03/25/16 ([facility] date 04/24/21)  on 05/01/21 at 8:09 AM.  The patient was provided with a copy of the signed informed consent form and separate HIPAA Authorization for their reference.  No study specific procedures were obtained prior to the signing of the informed consent document.  Approximately 15 minutes were spent with the patient reviewing the informed consent documents.  After obtaining informed consent patient, voluntarily signed the optional Release of Information form for use throughout trial participation. ? ?Debra Guiles 'Learta Codding' Rane Dumm, RN, BSN ?Clinical Research Nurse I ?05/01/21 ?10:54 AM ? ? ?

## 2021-05-01 NOTE — Progress Notes (Signed)
Patient has been assessed, vital signs and labs have been reviewed by Dr. Katragadda. ANC, Creatinine, LFTs, and Platelets are within treatment parameters per Dr. Katragadda. The patient is good to proceed with treatment at this time. Primary RN and pharmacy aware.  

## 2021-05-02 ENCOUNTER — Other Ambulatory Visit (HOSPITAL_COMMUNITY): Payer: BC Managed Care – PPO

## 2021-05-02 ENCOUNTER — Telehealth (HOSPITAL_COMMUNITY): Payer: Self-pay

## 2021-05-02 ENCOUNTER — Ambulatory Visit (HOSPITAL_COMMUNITY): Payer: BC Managed Care – PPO | Admitting: Physician Assistant

## 2021-05-02 NOTE — Telephone Encounter (Signed)
Chemotherapy 24 hour follow up call.  Patient stated she had some nausea last night and used the compazine as directed and felt better this morning.  Also, had a headache last night and this morning.  Has a history of migraines and is going to try her migraine medication/Excedrin migraine.  Instructed the patient if the nausea returns and the headache continues with no relief of either to call the cancer center. Understanding verbalized.   ?

## 2021-05-03 ENCOUNTER — Ambulatory Visit (HOSPITAL_COMMUNITY): Payer: BC Managed Care – PPO

## 2021-05-04 ENCOUNTER — Other Ambulatory Visit (HOSPITAL_COMMUNITY): Payer: Self-pay

## 2021-05-04 ENCOUNTER — Telehealth: Payer: Self-pay | Admitting: Radiology

## 2021-05-04 ENCOUNTER — Other Ambulatory Visit (HOSPITAL_COMMUNITY): Payer: Self-pay | Admitting: Hematology & Oncology

## 2021-05-04 ENCOUNTER — Encounter: Payer: Self-pay | Admitting: Radiology

## 2021-05-04 DIAGNOSIS — C50912 Malignant neoplasm of unspecified site of left female breast: Secondary | ICD-10-CM

## 2021-05-04 DIAGNOSIS — R110 Nausea: Secondary | ICD-10-CM

## 2021-05-04 MED ORDER — ONDANSETRON HCL 8 MG PO TABS: 8.0000 mg | ORAL_TABLET | Freq: Three times a day (TID) | ORAL | 1 refills | Status: DC | PRN

## 2021-05-04 NOTE — Progress Notes (Signed)
Patient called stating that since her first treatment earlier this week she has been having consistent nausea.   The nausea is resulting in not being able to eat very much.  She is taking the prescribed Compazine as prescribed, with little relief.  Zofran was sent to her pharmacy per standing orders by Dr. Delton Coombes.  ?

## 2021-05-04 NOTE — Telephone Encounter (Signed)
[id number] - TREATMENT OF REFRACTORY NAUSEA  ? ?05/04/2021     10:20AM ? ?4 DAY PHONE CALL: Confirmed I was speaking with Debra Vaughn. Informed patient reason for call was for a 4 day follow phone call to check in on nausea. Patient states she has had nausea greater than 3 most days and today is unable to eat due to nausea. Patient expressed interest in continuing to part 2 of the above listed study. Informed patient of next steps and requested for patient to mail back four day home record. Patient confirmed she would mail four day home record and documents today or tomorrow. This clinical research coordinator verified no new meds. Sent a message to Acey Lav in [place] to inform that medications would be ordered for patient at [facility]. Also sent a message to staff at [facility] to alert them of randomization and informed them this would result in a change to patients pre-meds for her next infusion on 05/15/2021. Patient was thanked for her time and continued support in the above mentioned study. This coordinator along with another research nurse will do a double check to verify patient is eligible for part 2.  ? ?Carol Ada, RT(R)(T) ?Clinical Research Coordinator  ?

## 2021-05-04 NOTE — Research (Signed)
[id number] - TREATMENT OF REFRACTORY NAUSEA  ? ?05/04/2021    10:50AM ? ?ELIGIBILITY REVIEW:  ?This Coordinator has reviewed this patient's inclusion and exclusion criteria and confirmed Debra Vaughn is eligible for study participation in part 2.  Patient will continue with enrollment. ? ?Menopausal status (women only): Debra Vaughn is pre-menopausal and had negative pregnancy tests on 05/01/2021 prior to first infusion. ? ? ?Eligibility will be confirmed by a research nurse, and treating investigator.  ? ?

## 2021-05-06 NOTE — Progress Notes (Signed)
? ?[facility] ?[address] ?[place],  [address] ? ? ?CLINIC:  ?Medical Oncology/Hematology ? ?PCP:  ?Debra Code, DO ?[place] / [place] [address] ?[phone] ? ? ?REASON FOR VISIT:   New start chemotherapy follow-up visit ? ?CURRENT THERAPY: ADJUVANT DOSE DENSE AC (doxorubicin + cyclophosphamide) q14d ? ?LAST TREATMENT DATE: 05/01/2021 ? 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 ?  ?05/01/2021 -  Chemotherapy  ? Patient is on Treatment Plan : BREAST ADJUVANT DOSE DENSE AC q14d / PACLitaxel q7d  ? ?  ?  ? ? ?CANCER STAGING: ? 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 ? ?INTERVAL HISTORY:  ?[name], a 46 y.o. female, is managed by Dr. 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  She received first cycle of treatment with doxorubicin and cyclophosphamide on 05/01/2021.  She received Neulasta on 05/01/2021.  She is seen today for toxicity check and follow-up of new start chemo. ? ?[name] had some nausea following chemotherapy.  Nausea described a moderate intensity with occasional spikes of "severe" nausea.  Compazine offered some relief, but without resolution.  She has not had any vomiting. ? ?She reports taste changes and decreased appetite.  For about 5 days following her chemo, she was only eating 25-50% of her usual oral intake, but is starting to get her appetite back and is now eating about 80% of her usual diet.  She drinks 40 to 60 ounces of water daily.  Weight today is down about 5 pounds since last week. ?She reports that she has about 50% energy. ? ?She reports presence of mouth sores that she describes as  mildly painful. ? ?She has some ongoing constipation, but reports that she has been taking only 1 stool softener daily instead of 2 as recommended.  She denies any diarrhea. ? ?She is noted to have significant leukopenia and neutropenia today.  She denies any fevers, chills, or night sweats. ? ? ?REVIEW OF SYSTEMS:  ?Review of Systems  ?Constitutional:  Positive for appetite change, fatigue and unexpected weight change. Negative for chills, diaphoresis and fever.  ?HENT:   Positive for mouth sores. Negative for lump/mass and nosebleeds.   ?Eyes:  Negative for eye problems.  ?Respiratory:  Positive for cough (allergies). Negative for hemoptysis and shortness of breath.   ?Cardiovascular:  Negative for chest pain, leg swelling and palpitations.  ?Gastrointestinal:  Positive for constipation and nausea. Negative for abdominal pain, blood in stool, diarrhea and vomiting.  ?Genitourinary:  Negative for hematuria.   ?Skin: Negative.   ?Neurological:  Negative for dizziness, headaches and light-headedness.  ?Hematological:  Does not bruise/bleed easily.  ?Psychiatric/Behavioral:  Positive for depression and sleep disturbance. The patient is nervous/anxious.   ? ?PAST MEDICAL/SURGICAL HISTORY:  ?No past medical history on file. ?Past Surgical History:  ?Procedure Laterality Date  ? BREAST BIOPSY Right 04/06/2021  ? ? ?SOCIAL HISTORY:  ?Social History  ? ?Socioeconomic History  ? Marital status: Married  ?  Spouse name: Not on file  ? Number of children: Not on file  ? Years of education: Not on file  ? Highest education level: Not on  file  ?Occupational History  ? Not on file  ?Tobacco Use  ? Smoking status: Never  ? Smokeless tobacco: Never  ?Vaping Use  ? Vaping Use: Never used  ?Substance and Sexual Activity  ? Alcohol use: Not Currently  ?  Comment: occasionally  ? Drug use: Never  ? Sexual activity: Not Currently  ?Other Topics Concern  ? Not on file  ?Social History Narrative  ? Not on file  ? ?Social Determinants of  Health  ? ?Financial Resource Strain: Not on file  ?Food Insecurity: Not on file  ?Transportation Needs: Not on file  ?Physical Activity: Not on file  ?Stress: Not on file  ?Social Connections: Not on file  ?Intimate Partner Violence: Not on file  ? ? ?FAMILY HISTORY: Family history has been reviewed by me and is documented elsewhere in the electronic medical record. ? ?CURRENT MEDICATIONS: Current medications have been reviewed by me and are documented elsewhere in electronic medical record. ? ?ALLERGIES: Drug allergies have been reviewed by me and are documented elsewhere in the electronic medical record. ? ?PHYSICAL EXAM:  ?Performance status (ECOG): 1 - Symptomatic but completely ambulatory ?There were no vitals filed for this visit. ?Wt Readings from Last 3 Encounters:  ?05/08/21 163 lb 12.8 oz (74.3 kg)  ?05/01/21 169 lb 1.6 oz (76.7 kg)  ?04/12/21 167 lb 8.8 oz (76 kg)  ? ?Physical Exam ?Constitutional:   ?   Appearance: Normal appearance.  ?   Comments: Port in place over right upper chest wall  ?HENT:  ?   Head: Normocephalic and atraumatic.  ?   Mouth/Throat:  ?   Mouth: Mucous membranes are moist.  ?   Comments: Faint erythema and shallow whitish ulcers noted on lower lip and left upper buccal mucosa ?Eyes:  ?   Extraocular Movements: Extraocular movements intact.  ?   Pupils: Pupils are equal, round, and reactive to light.  ?Cardiovascular:  ?   Rate and Rhythm: Regular rhythm. Tachycardia present.  ?   Pulses: Normal pulses.  ?   Heart sounds: Normal heart sounds.  ?   Comments: HR 98 ?Pulmonary:  ?   Effort: Pulmonary effort is normal.  ?   Breath sounds: Normal breath sounds.  ?Abdominal:  ?   General: Bowel sounds are normal.  ?   Palpations: Abdomen is soft.  ?   Tenderness: There is no abdominal tenderness.  ?Musculoskeletal:     ?   General: No swelling.  ?   Right lower leg: No edema.  ?   Left lower leg: No edema.  ?Lymphadenopathy:  ?   Cervical: No cervical adenopathy.  ?Skin: ?   General:  Skin is warm and dry.  ?Neurological:  ?   General: No focal deficit present.  ?   Mental Status: She is alert and oriented to person, place, and time.  ?Psychiatric:     ?   Mood and Affect: Mood normal.     ?   Behavior: Behavior normal.  ?  ? ?LABORATORY DATA: Relevant labs have been reviewed by me and are documented elsewhere in the electronic medical record. ? ?DIAGNOSTIC IMAGING: Relevant imaging has been reviewed by me and are documented elsewhere in the electronic medical record. ? ?ASSESSMENT & PLAN: ?1.  Stage II left breast cancer ?- Primary oncologist is Dr. 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 ?- Received first cycle of treatment with doxorubicin and cyclophosphamide on 05/01/2021.  She received Neulasta on 05/01/3021. ?-Patient's chief complaints after first cycle of chemotherapy was taste change/loss of appetite, nausea, constipation, and mouth sores ?- CBC (05/08/21 ): WBC 1.5/ANC 0.5/ALC 0.6, normal Hgb 12.0, platelets 167. ?- CMP (05/08/21): Normal LFTs, kidney function, and electrolytes.  Normal magnesium. ?- PLAN: Patient is scheduled for labs, follow-up visit, and next cycle of treatment on 05/15/2021 ? ?2.  Nausea ?- Per RN note 05/04/21 - consistent nausea not relieved with compazine, Rx for Zofran sent to pharmacy on 05/04/21 ?-- She is on nausea research protocol ?- PLAN: Continue Compazine and Zofran as needed.  Alternating doses allowed. ? ?3.  Nutrition & hydration ?- She reports taste changes and decreased appetite.  For about 5 days following her chemo, she was only eating 25-50% of her usual oral intake, but is starting to get her appetite back and is now eating about 80% of her usual diet.  She drinks 40 to 60 ounces of water daily.  ?- Weight today 163 pounds (down about 4-6 pounds in the past week) ?- PLAN: No indication for IV fluids at this time.  We will hold off on dietitian referral for the time being, but would consider this if  she has any further weight loss. ?- Encouraged small frequent meals ?- Encouraged nutritional supplementation with Ensure or equivalent twice daily if she is having poor appetite ?- Instructed patient to drin

## 2021-05-07 ENCOUNTER — Encounter (HOSPITAL_COMMUNITY): Payer: Self-pay | Admitting: *Deleted

## 2021-05-07 ENCOUNTER — Other Ambulatory Visit (HOSPITAL_COMMUNITY): Payer: Self-pay | Admitting: *Deleted

## 2021-05-07 ENCOUNTER — Telehealth (HOSPITAL_COMMUNITY): Payer: Self-pay | Admitting: *Deleted

## 2021-05-07 DIAGNOSIS — R110 Nausea: Secondary | ICD-10-CM

## 2021-05-07 DIAGNOSIS — C50912 Malignant neoplasm of unspecified site of left female breast: Secondary | ICD-10-CM

## 2021-05-07 MED ORDER — ONDANSETRON HCL 8 MG PO TABS: 8.0000 mg | ORAL_TABLET | Freq: Three times a day (TID) | ORAL | 1 refills | Status: AC | PRN

## 2021-05-07 NOTE — Telephone Encounter (Signed)
Patient called to advise that she has had no adverse reactions or complications from first treatment on 4/25, with the exception of some mild constipation.  Is taking 1 stool softener daily and was instructed that she can take up to 3 a day.  Will increase dose and will follow up as planned for labs and appointment with Tarri Abernethy [facility] tomorrow. ?

## 2021-05-08 ENCOUNTER — Inpatient Hospital Stay (HOSPITAL_COMMUNITY): Payer: BC Managed Care – PPO | Admitting: Physician Assistant

## 2021-05-08 ENCOUNTER — Inpatient Hospital Stay (HOSPITAL_COMMUNITY): Payer: BC Managed Care – PPO | Attending: Hematology

## 2021-05-08 VITALS — BP 121/50 | HR 98 | Temp 98.1°F | Resp 20 | Ht 68.31 in | Wt 163.8 lb

## 2021-05-08 DIAGNOSIS — Z95828 Presence of other vascular implants and grafts: Secondary | ICD-10-CM

## 2021-05-08 DIAGNOSIS — Z170 Estrogen receptor positive status [ER+]: Secondary | ICD-10-CM | POA: Diagnosis not present

## 2021-05-08 DIAGNOSIS — Z5189 Encounter for other specified aftercare: Secondary | ICD-10-CM | POA: Insufficient documentation

## 2021-05-08 DIAGNOSIS — D72819 Decreased white blood cell count, unspecified: Secondary | ICD-10-CM | POA: Insufficient documentation

## 2021-05-08 DIAGNOSIS — K5900 Constipation, unspecified: Secondary | ICD-10-CM | POA: Insufficient documentation

## 2021-05-08 DIAGNOSIS — Z09 Encounter for follow-up examination after completed treatment for conditions other than malignant neoplasm: Secondary | ICD-10-CM

## 2021-05-08 DIAGNOSIS — T451X5A Adverse effect of antineoplastic and immunosuppressive drugs, initial encounter: Secondary | ICD-10-CM

## 2021-05-08 DIAGNOSIS — C50912 Malignant neoplasm of unspecified site of left female breast: Secondary | ICD-10-CM | POA: Diagnosis not present

## 2021-05-08 DIAGNOSIS — K1231 Oral mucositis (ulcerative) due to antineoplastic therapy: Secondary | ICD-10-CM | POA: Diagnosis not present

## 2021-05-08 DIAGNOSIS — Z5111 Encounter for antineoplastic chemotherapy: Secondary | ICD-10-CM | POA: Diagnosis not present

## 2021-05-08 DIAGNOSIS — R110 Nausea: Secondary | ICD-10-CM | POA: Insufficient documentation

## 2021-05-08 LAB — CBC WITH DIFFERENTIAL/PLATELET
Band Neutrophils: 11 %
Basophils Absolute: 0 10*3/uL (ref 0.0–0.1)
Basophils Relative: 1 %
Eosinophils Absolute: 0.2 10*3/uL (ref 0.0–0.5)
Eosinophils Relative: 13 %
HCT: 36.2 % (ref 36.0–46.0)
Hemoglobin: 12 g/dL (ref 12.0–15.0)
Lymphocytes Relative: 42 %
Lymphs Abs: 0.6 10*3/uL — ABNORMAL LOW (ref 0.7–4.0)
MCH: 29.9 pg (ref 26.0–34.0)
MCHC: 33.1 g/dL (ref 30.0–36.0)
MCV: 90.3 fL (ref 80.0–100.0)
Metamyelocytes Relative: 2 %
Monocytes Absolute: 0.2 10*3/uL (ref 0.1–1.0)
Monocytes Relative: 11 %
Neutro Abs: 0.5 10*3/uL — ABNORMAL LOW (ref 1.7–7.7)
Neutrophils Relative %: 20 %
Platelets: 167 10*3/uL (ref 150–400)
RBC: 4.01 MIL/uL (ref 3.87–5.11)
RDW: 12.4 % (ref 11.5–15.5)
WBC: 1.5 10*3/uL — ABNORMAL LOW (ref 4.0–10.5)
nRBC: 0 % (ref 0.0–0.2)

## 2021-05-08 LAB — COMPREHENSIVE METABOLIC PANEL
ALT: 14 U/L (ref 0–44)
AST: 14 U/L — ABNORMAL LOW (ref 15–41)
Albumin: 3.9 g/dL (ref 3.5–5.0)
Alkaline Phosphatase: 93 U/L (ref 38–126)
Anion gap: 7 (ref 5–15)
BUN: 12 mg/dL (ref 6–20)
CO2: 24 mmol/L (ref 22–32)
Calcium: 9.2 mg/dL (ref 8.9–10.3)
Chloride: 106 mmol/L (ref 98–111)
Creatinine, Ser: 0.57 mg/dL (ref 0.44–1.00)
GFR, Estimated: >60 mL/min (ref >60)
Glucose, Bld: 89 mg/dL (ref 70–99)
Potassium: 4.1 mmol/L (ref 3.5–5.1)
Sodium: 137 mmol/L (ref 135–145)
Total Bilirubin: 0.5 mg/dL (ref 0.3–1.2)
Total Protein: 7.7 g/dL (ref 6.5–8.1)

## 2021-05-08 LAB — MAGNESIUM: Magnesium: 1.9 mg/dL (ref 1.7–2.4)

## 2021-05-08 MED ORDER — SUCRALFATE 1 GM/10ML PO SUSP: 1.0000 g | Freq: Four times a day (QID) | ORAL | 2 refills | Status: DC

## 2021-05-08 MED ORDER — SUCRALFATE 1 GM/10ML PO SUSP: 1.0000 g | Freq: Four times a day (QID) | ORAL | 0 refills | Status: DC

## 2021-05-08 MED ORDER — HEPARIN SOD (PORK) LOCK FLUSH 100 UNIT/ML IV SOLN
500.0000 [IU] | Freq: Once | INTRAVENOUS | Status: AC
  Administered 2021-05-08: 500 [IU] via INTRAVENOUS

## 2021-05-08 MED ORDER — SODIUM CHLORIDE 0.9% FLUSH
10.0000 mL | INTRAVENOUS | Status: AC | PRN
  Administered 2021-05-08: 10 mL via INTRAVENOUS

## 2021-05-08 NOTE — Addendum Note (Signed)
Addended by: Donnie Aho on: 05/08/2021 12:33 PM ? ? Modules accepted: Orders ? ?

## 2021-05-08 NOTE — Patient Instructions (Addendum)
[place] at [facility] ?Discharge Instructions ? ?You were seen today by Tarri Abernethy PA-C for your chemotherapy follow-up visit.   ? ?NAUSEA: You can continue to take Compazine and/or Zofran, which ever is most helpful in treating your nausea.  You can alternate these doses as needed. ? ?MOUTH SORES: Prescription for "Magic mouthwash" has been sent to your pharmacy.  You should "swish and swallow" 1 tablespoon 4 times daily. ? ?NUTRITION: Eat small and more frequent meals.  If you have low appetite, try to drink 2 Ensure or Boost beverages daily. ? ?HYDRATION: Drink at least 64 ounces of water daily. ? ?LOW WHITE BLOOD CELLS: This places you at increased risk of infection.  Please see the attached handouts for guidelines to follow that will decrease your risk of infection while your blood cells are low. ? ?We will see you for follow-up visit on 05/15/2021 for labs, office visit, and your next cycle of chemotherapy. ? ? ?Thank you for choosing [place] at [facility] to provide your oncology and hematology care.  To afford each patient quality time with our provider, please arrive at least 15 minutes before your scheduled appointment time.  ? ?If you have a lab appointment with the [facility] please come in thru the Main Entrance and check in at the main information desk. ? ?You need to re-schedule your appointment should you arrive 10 or more minutes late.  We strive to give you quality time with our providers, and arriving late affects you and other patients whose appointments are after yours.  Also, if you no show three or more times for appointments you may be dismissed from the clinic at the providers discretion.     ?Again, thank you for choosing [facility].  Our hope is that these requests will decrease the amount of time that you wait before being seen by our physicians.        ?_____________________________________________________________ ? ?Should you have questions after your visit to [facility], please contact our office at [phone] and follow the prompts.  Our office hours are 8:00 a.m. and 4:30 p.m. Monday - Friday.  Please note that voicemails left after 4:00 p.m. may not be returned until the following business day.  We are closed weekends and major holidays.  You do have access to a nurse 24-7, just call the main number to the clinic [phone] and do not press any options, hold on the line and a nurse will answer the phone.   ? ?For prescription refill requests, have your pharmacy contact our office and allow 72 hours.   ? ?Due to Covid, you will need to wear a mask upon entering the hospital. If you do not have a mask, a mask will be given to you at the Main Entrance upon arrival. For doctor visits, patients may have 1 support person age 10 or older with them. For treatment visits, patients can not have anyone with them due to social distancing guidelines and our immunocompromised population.  ? ? ? ?

## 2021-05-09 ENCOUNTER — Other Ambulatory Visit (HOSPITAL_COMMUNITY): Payer: BC Managed Care – PPO

## 2021-05-09 ENCOUNTER — Ambulatory Visit (HOSPITAL_COMMUNITY): Payer: BC Managed Care – PPO

## 2021-05-09 ENCOUNTER — Ambulatory Visit (HOSPITAL_COMMUNITY): Payer: BC Managed Care – PPO | Admitting: Hematology

## 2021-05-10 ENCOUNTER — Encounter (HOSPITAL_COMMUNITY): Payer: Self-pay

## 2021-05-10 ENCOUNTER — Other Ambulatory Visit: Payer: Self-pay | Admitting: *Deleted

## 2021-05-10 DIAGNOSIS — C50912 Malignant neoplasm of unspecified site of left female breast: Secondary | ICD-10-CM

## 2021-05-10 NOTE — Research (Addendum)
[id number] - TREATMENT OF REFRACTORY NAUSEA ? ?Spoke with [name], who has not gotten a chance to send the questionnaires in yet. Provided her with my fax and email information and requested she get them to [place] asap, as we need to randomize her and order the medication. She will send them in today. ? ?[name] [name], RN, BSN, CHPN ?She  Her  Hers ?Clinical Research Nurse ?[place] ?Direct Dial [phone]  Pager [phone] ?05/10/2021 2:54 PM ? ? ? ? ?1626: 4 day home record rec'd and entered into [place]. Randomization complete. Phonacelle Debra Vaughn will enter pt's treatment plan. ? ?[name] [name], RN, BSN, CHPN ?She  Her  Hers ?Clinical Research Nurse ?[place] ?Direct Dial [phone]  Pager [phone] ?05/10/2021 4:27 PM ?

## 2021-05-10 NOTE — Research (Signed)
[id number] - TREATMENT OF REFRACTORY NAUSEA  ?This Nurse has reviewed this patient's inclusion and exclusion criteria as a second review and confirms Debra Vaughn is eligible for study participation.  Patient may continue with enrollment.  ?Foye Spurling, BSN, RN, CCRP ?Clinical Research Nurse II ?05/10/2021 9:24 AM ? ?

## 2021-05-10 NOTE — Research (Signed)
[id number] - TREATMENT OF REFRACTORY NAUSEA ? ? ?This Nurse has reviewed this patient's inclusion and exclusion criteria and confirmed Eydie E Wimbush is eligible for study participation.  Patient will continue with Cycle 2 randomization. ? ?Menopausal status (women only): Hilari E Leamy is [list: post menopausal with LMP (enter date); pre- or peri- menopausal and patient utilizes abstinence for contraception. ? ?Eligibility confirmed by treating investigator, who also agrees that patient should proceed with enrollment. ? ?[name]. Salem Mastrogiovanni, RN, BSN, CHPN ?She  Her  Hers ?Clinical Research Nurse ?[place] ?Direct Dial [phone]  Pager [phone] ?05/10/2021 9:22 AM ?

## 2021-05-11 ENCOUNTER — Ambulatory Visit (HOSPITAL_COMMUNITY): Payer: BC Managed Care – PPO

## 2021-05-15 ENCOUNTER — Encounter: Payer: Self-pay | Admitting: Radiology

## 2021-05-15 ENCOUNTER — Ambulatory Visit (HOSPITAL_COMMUNITY): Payer: BC Managed Care – PPO | Admitting: Physician Assistant

## 2021-05-15 ENCOUNTER — Other Ambulatory Visit: Payer: Self-pay

## 2021-05-15 ENCOUNTER — Inpatient Hospital Stay (HOSPITAL_COMMUNITY): Payer: BC Managed Care – PPO

## 2021-05-15 ENCOUNTER — Other Ambulatory Visit: Payer: Self-pay | Admitting: Emergency Medicine

## 2021-05-15 VITALS — BP 119/68 | HR 87 | Temp 96.9°F | Resp 18

## 2021-05-15 DIAGNOSIS — C50912 Malignant neoplasm of unspecified site of left female breast: Secondary | ICD-10-CM

## 2021-05-15 DIAGNOSIS — Z5111 Encounter for antineoplastic chemotherapy: Secondary | ICD-10-CM | POA: Diagnosis not present

## 2021-05-15 LAB — CBC WITH DIFFERENTIAL/PLATELET
Band Neutrophils: 3 %
Basophils Absolute: 0 10*3/uL (ref 0.0–0.1)
Basophils Relative: 0 %
Eosinophils Absolute: 0 10*3/uL (ref 0.0–0.5)
Eosinophils Relative: 0 %
HCT: 35.9 % — ABNORMAL LOW (ref 36.0–46.0)
Hemoglobin: 12.2 g/dL (ref 12.0–15.0)
Lymphocytes Relative: 7 %
Lymphs Abs: 1 10*3/uL (ref 0.7–4.0)
MCH: 30.6 pg (ref 26.0–34.0)
MCHC: 34 g/dL (ref 30.0–36.0)
MCV: 90 fL (ref 80.0–100.0)
Metamyelocytes Relative: 1 %
Monocytes Absolute: 0.3 10*3/uL (ref 0.1–1.0)
Monocytes Relative: 2 %
Myelocytes: 7 %
Neutro Abs: 11.9 10*3/uL — ABNORMAL HIGH (ref 1.7–7.7)
Neutrophils Relative %: 80 %
Platelets: 205 10*3/uL (ref 150–400)
RBC: 3.99 MIL/uL (ref 3.87–5.11)
RDW: 12.7 % (ref 11.5–15.5)
WBC: 14.3 10*3/uL — ABNORMAL HIGH (ref 4.0–10.5)
nRBC: 0 % (ref 0.0–0.2)

## 2021-05-15 LAB — COMPREHENSIVE METABOLIC PANEL
ALT: 15 U/L (ref 0–44)
AST: 18 U/L (ref 15–41)
Albumin: 3.8 g/dL (ref 3.5–5.0)
Alkaline Phosphatase: 90 U/L (ref 38–126)
Anion gap: 6 (ref 5–15)
BUN: 11 mg/dL (ref 6–20)
CO2: 23 mmol/L (ref 22–32)
Calcium: 9 mg/dL (ref 8.9–10.3)
Chloride: 110 mmol/L (ref 98–111)
Creatinine, Ser: 0.71 mg/dL (ref 0.44–1.00)
GFR, Estimated: >60 mL/min (ref >60)
Glucose, Bld: 106 mg/dL — ABNORMAL HIGH (ref 70–99)
Potassium: 3.6 mmol/L (ref 3.5–5.1)
Sodium: 139 mmol/L (ref 135–145)
Total Bilirubin: 0.4 mg/dL (ref 0.3–1.2)
Total Protein: 7.3 g/dL (ref 6.5–8.1)

## 2021-05-15 LAB — MAGNESIUM: Magnesium: 1.9 mg/dL (ref 1.7–2.4)

## 2021-05-15 MED ORDER — PEGFILGRASTIM 6 MG/0.6ML ~~LOC~~ PSKT
6.0000 mg | PREFILLED_SYRINGE | Freq: Once | SUBCUTANEOUS | Status: AC
  Administered 2021-05-15: 6 mg via SUBCUTANEOUS
  Filled 2021-05-15: qty 0.6

## 2021-05-15 MED ORDER — INV-DEXAMETHASONE 4 MG PO TAB URCC 16070 BLISTER CARD 2: 2.0000 | ORAL_TABLET | Freq: Every day | ORAL | 0 refills | Status: DC

## 2021-05-15 MED ORDER — SODIUM CHLORIDE 0.9% FLUSH
10.0000 mL | INTRAVENOUS | Status: DC | PRN
  Administered 2021-05-15: 10 mL

## 2021-05-15 MED ORDER — DOXORUBICIN HCL CHEMO IV INJECTION 2 MG/ML
60.0000 mg/m2 | Freq: Once | INTRAVENOUS | Status: AC
  Administered 2021-05-15: 114 mg via INTRAVENOUS
  Filled 2021-05-15: qty 57

## 2021-05-15 MED ORDER — INV-PROCHLORPERAZINE MALEATE/PLACEBO 10 MG CAP URCC 16070 BLISTER CARD 2: 1.0000 | ORAL_CAPSULE | Freq: Three times a day (TID) | ORAL | 0 refills | Status: DC

## 2021-05-15 MED ORDER — HEPARIN SOD (PORK) LOCK FLUSH 100 UNIT/ML IV SOLN
500.0000 [IU] | Freq: Once | INTRAVENOUS | Status: AC | PRN
  Administered 2021-05-15: 500 [IU]

## 2021-05-15 MED ORDER — SODIUM CHLORIDE 0.9 % IV SOLN: Freq: Once | INTRAVENOUS | Status: AC

## 2021-05-15 MED ORDER — SODIUM CHLORIDE 0.9 % IV SOLN
600.0000 mg/m2 | Freq: Once | INTRAVENOUS | Status: AC
  Administered 2021-05-15: 1140 mg via INTRAVENOUS
  Filled 2021-05-15: qty 57

## 2021-05-15 MED ORDER — INV-PROCHLORPERAZINE MALEATE/PLACEBO 10 MG CAP URCC 16070
ORAL_TABLET | Freq: Once | ORAL | Status: AC
  Filled 2021-05-15: qty 1

## 2021-05-15 MED ORDER — INV-OLANZAPINE/PLACEBO 10 MG CAP URCC 16070 BLISTER CARD 2: 1.0000 | ORAL_CAPSULE | Freq: Every day | ORAL | 0 refills | Status: DC

## 2021-05-15 MED ORDER — ACETAMINOPHEN 325 MG PO TABS
650.0000 mg | ORAL_TABLET | Freq: Once | ORAL | Status: AC
  Administered 2021-05-15: 650 mg via ORAL
  Filled 2021-05-15: qty 2

## 2021-05-15 NOTE — Progress Notes (Signed)
Pt presents today for Adriamycin, Cytoxan,and On pro per provider's order. Pre-meds given by [employer]. Labs and vital signs WNL for treatment today. Okay to proceed with treatment. ? ?During treatment pt c/o headache 3/10 pain level, message sent to R.Pennington-PA and she stated okay to give Tylenol 650 mg p.o x 1 dose. ? ?Adriamycin, Cytoxan, and On pro and Tylenol 650 mg p.o x 1 dose  given today per MD orders. Tolerated infusion without adverse affects. Vital signs stable. No complaints at this time. Discharged from clinic ambulatory in stable condition. Alert and oriented x 3. F/U with [facility] as scheduled.   ?

## 2021-05-15 NOTE — Research (Signed)
[id number] - TREATMENT OF REFRACTORY NAUSEA ? ?Patient was seen in infusion. Confirmed research consent complete.  ? ?Pharmacist confirms medications were rec'd at 23.7 degrees Celcius, which is in range. ? ?Patient's labs are within parameters set by Dr Delton Coombes, so she will proceed with chemo today. ? ?I administered Akynzeo (netupitant/palonosetron) PO at 1048. ? ?I administered dexamethasone at 1119. ? ?Chemo was late getting ready; the olanzepine/placebo & prochlorperazine/placebo were administered at 1207, which is when chemo was started. Patient was aware of the potential of placebo.  ? ?Pt was continuing her chemo infusion w her chemo RN at bedside. Neither pt nor RN had further questions for the research team. Instructed pt that a research team member will reach out to her Friday for her 4 day call; she verbalized understanding.  ? ?[name] Jerline Linzy, RN, BSN, CHPN ?She  Her  Hers ?Clinical Research Nurse ?[place] ?Direct Dial [phone]  Pager [phone] ?05/15/2021 1:43 PM ?

## 2021-05-15 NOTE — Patient Instructions (Signed)
[facility]  Discharge Instructions: ?Thank you for choosing [facility] to provide your oncology and hematology care.  ?If you have a lab appointment with the [facility], please come in thru the Main Entrance and check in at the main information desk. ? ?Wear comfortable clothing and clothing appropriate for easy access to any Portacath or PICC line.  ? ?We strive to give you quality time with your provider. You may need to reschedule your appointment if you arrive late (15 or more minutes).  Arriving late affects you and other patients whose appointments are after yours.  Also, if you miss three or more appointments without notifying the office, you may be dismissed from the clinic at the provider?s discretion.    ?  ?For prescription refill requests, have your pharmacy contact our office and allow 72 hours for refills to be completed.   ? ?Today you received the following chemotherapy and/or immunotherapy agents Adriamycin and Cytoxan ?  ?To help prevent nausea and vomiting after your treatment, we encourage you to take your nausea medication as directed. ? ?BELOW ARE SYMPTOMS THAT SHOULD BE REPORTED IMMEDIATELY: ?*FEVER GREATER THAN 100.4 F (38 ?C) OR HIGHER ?*CHILLS OR SWEATING ?*NAUSEA AND VOMITING THAT IS NOT CONTROLLED WITH YOUR NAUSEA MEDICATION ?*UNUSUAL SHORTNESS OF BREATH ?*UNUSUAL BRUISING OR BLEEDING ?*URINARY PROBLEMS (pain or burning when urinating, or frequent urination) ?*BOWEL PROBLEMS (unusual diarrhea, constipation, pain near the anus) ?TENDERNESS IN MOUTH AND THROAT WITH OR WITHOUT PRESENCE OF ULCERS (sore throat, sores in mouth, or a toothache) ?UNUSUAL RASH, SWELLING OR PAIN  ?UNUSUAL VAGINAL DISCHARGE OR ITCHING  ? ?Items with * indicate a potential emergency and should be followed up as soon as possible or go to the Emergency Department if any problems should occur. ? ?Please show the CHEMOTHERAPY ALERT CARD or IMMUNOTHERAPY ALERT CARD at check-in to the  Emergency Department and triage nurse. ? ?Should you have questions after your visit or need to cancel or reschedule your appointment, please contact [facility] [phone]  and follow the prompts.  Office hours are 8:00 a.m. to 4:30 p.m. Monday - Friday. Please note that voicemails left after 4:00 p.m. may not be returned until the following business day.  We are closed weekends and major holidays. You have access to a nurse at all times for urgent questions. Please call the main number to the clinic [phone] and follow the prompts. ? ?For any non-urgent questions, you may also contact your provider using MyChart. We now offer e-Visits for anyone 27 and older to request care online for non-urgent symptoms. For details visit [email or web address]. ?  ?Also download the MyChart app! Go to the app store, search "MyChart", open the app, select [facility], and log in with your MyChart username and password. ? ?Due to Covid, a mask is required upon entering the hospital/clinic. If you do not have a mask, one will be given to you upon arrival. For doctor visits, patients may have 1 support person aged 18 or older with them. For treatment visits, patients cannot have anyone with them due to current Covid guidelines and our immunocompromised population.  ?

## 2021-05-15 NOTE — Progress Notes (Signed)
Patients port flushed without difficulty.  Good blood return noted with no bruising or swelling noted at site.  Stable during access and blood draw.  Patient to remain accessed for treatment. 

## 2021-05-15 NOTE — Research (Signed)
[id number] - TREATMENT OF REFRACTORY NAUSEA  ? ?05/15/2021 ? ?CYCLE 2: This clinical research coordinator gave Cycle 2 documents to patient. Informed patient she would receive e-mail with questionnaires just as before and would need to return Medication Diary and Four day Home Record via mail with pre-paid envelope. Patient expressed understanding. Thanked patient for her time and continued participation in the above mentioned study.  ? ?Carol Ada, RT(R)(T) ?Clinical Research Coordinator ? ? ?

## 2021-05-15 NOTE — Research (Signed)
DCP-001: Use of a Clinical Trial Screening Tool to Address Cancer Health Disparities in the [place] [facility]); ? ?05/15/2021 ? ?CONSENT: Patient Debra Vaughn was identified by this clinical Research officer, political party as a potential candidate for the above listed study.  This Clinical Research Coordinator met with Debra Vaughn, [id number] on 05/15/21 in a manner and location that ensures patient privacy to discuss participation in the above listed research study.  Patient is Accompanied by her father .  Patient was previously provided with informed consent documents.  Patient confirmed they have read the informed consent documents. ? ?As outlined in the informed consent form, this Coordinator and Reia E Daza discussed the purpose of the research study, the investigational nature of the study, study procedures and requirements for study participation, potential risks and benefits of study participation, as well as alternatives to participation.  This study is not blinded or double-blinded. The patient understands participation is voluntary and they may withdraw from study participation at any time.  This study does not involve randomization.  This study does not involve an investigational drug or device. This study does not involve a placebo. Patient understands enrollment is pending full eligibility review.  ? ?Confidentiality and how the patient's information will be used as part of study participation were discussed.  Patient was informed there is not reimbursement provided for their time and effort spent on trial participation.  The patient is encouraged to discuss research study participation with their insurance provider to determine what costs they may incur as part of study participation, including research related injury.   ? ?All questions were answered to patient's satisfaction.  The informed consent and separate HIPAA Authorization was reviewed page by page.  The patient's mental and  emotional status is appropriate to provide informed consent, and the patient verbalizes an understanding of study participation.  Patient has agreed to participate in the above listed research study and has voluntarily signed the informed consent version dated 02/05/2021 and separate HIPAA Authorization, version 15, dated 10/17/2020  on 05/15/21 at 1025AM.  The patient was provided with a copy of the signed informed consent form and separate HIPAA Authorization for their reference.  No study specific procedures were obtained prior to the signing of the informed consent document.  Approximately 15 minutes were spent with the patient reviewing the informed consent documents.   ? ?After consent was obtained, patient completed the DCP-001 Worksheet. Patient was thanked for her time and participation in the above mentioned trial.  ? ?Carol Ada, RT(R)(T) ?Clinical Research Coordinator ? ?

## 2021-05-17 ENCOUNTER — Ambulatory Visit (HOSPITAL_COMMUNITY): Payer: BC Managed Care – PPO

## 2021-05-18 ENCOUNTER — Telehealth: Payer: Self-pay | Admitting: Emergency Medicine

## 2021-05-18 NOTE — Telephone Encounter (Signed)
[id number] - TREATMENT OF REFRACTORY NAUSEA ? ?05/18/21 - Cycle 2 Day 4 Call ? ?9:35am - Called to complete cycle 2 day 4 call.  Patient did not answer, left voicemail requesting return call. ? ?11:00am - The patient returned call, left voicemail. ? ?11:24am - Called and spoke with patient.  ? ?She states her nausea has been improved this cycle compared to her symptoms in cycle 1. She states she is still having a reduced appetite, but not as severe as last cycle.  She states she did not take the mid-day dose for day 1 as she was still in the clinic receiving chemo at that time.  She reports taking the other doses on time and not missing any further doses as of now.  She has two more doses scheduled to be taken later today, a mid-day and evening dose.  She denies any side effects from the study medication or any adverse events.  She was reminded to save the unused medication to return with her when she comes to the clinic again. ? ?The patient was reminded to complete cycle 2 questionnaires online as well as the four day home diary and medication diary on paper.    She was advised to mail in the completed paper questionnaires, or save and bring them with her to her next appointment.  The patient verbalized understanding of all instructions. ? ?She was thanked for her time and participation in this study, and was advised to call with any questions or concerns.  ? ?Clabe Seal ?Clinical Research Coordinator I  ?05/18/21  11:36 AM  ?

## 2021-05-22 ENCOUNTER — Encounter: Payer: Self-pay | Admitting: Hematology

## 2021-05-22 ENCOUNTER — Encounter (HOSPITAL_COMMUNITY): Payer: Self-pay | Admitting: Hematology

## 2021-05-28 ENCOUNTER — Telehealth: Payer: Self-pay | Admitting: Radiology

## 2021-05-28 NOTE — Telephone Encounter (Signed)
[id number] - TREATMENT OF REFRACTORY NAUSEA   05/28/21  PHONE CALL: Spoke with Charnele E Garrison . Reason for call was to inform patient this research coordinator and a research nurse would see her tomorrow morning to collect study medication as well as questionnaires from cycle 2. Patient was thanked for her time and support in the above mentioned study.   Carol Ada, RT(R)(T) Clinical Research Coordinator

## 2021-05-29 ENCOUNTER — Encounter: Payer: Self-pay | Admitting: Radiology

## 2021-05-29 ENCOUNTER — Inpatient Hospital Stay (HOSPITAL_BASED_OUTPATIENT_CLINIC_OR_DEPARTMENT_OTHER): Payer: BC Managed Care – PPO | Admitting: Hematology

## 2021-05-29 ENCOUNTER — Inpatient Hospital Stay (HOSPITAL_COMMUNITY): Payer: BC Managed Care – PPO

## 2021-05-29 VITALS — BP 121/77 | HR 85 | Temp 96.7°F | Resp 16

## 2021-05-29 DIAGNOSIS — Z5111 Encounter for antineoplastic chemotherapy: Secondary | ICD-10-CM | POA: Diagnosis not present

## 2021-05-29 DIAGNOSIS — C50912 Malignant neoplasm of unspecified site of left female breast: Secondary | ICD-10-CM

## 2021-05-29 LAB — CBC WITH DIFFERENTIAL/PLATELET
Abs Immature Granulocytes: 4.98 10*3/uL — ABNORMAL HIGH (ref 0.00–0.07)
Basophils Absolute: 0.1 10*3/uL (ref 0.0–0.1)
Basophils Relative: 0 %
Eosinophils Absolute: 0 10*3/uL (ref 0.0–0.5)
Eosinophils Relative: 0 %
HCT: 33.2 % — ABNORMAL LOW (ref 36.0–46.0)
Hemoglobin: 11.4 g/dL — ABNORMAL LOW (ref 12.0–15.0)
Immature Granulocytes: 25 %
Lymphocytes Relative: 6 %
Lymphs Abs: 1.2 10*3/uL (ref 0.7–4.0)
MCH: 30.6 pg (ref 26.0–34.0)
MCHC: 34.3 g/dL (ref 30.0–36.0)
MCV: 89.2 fL (ref 80.0–100.0)
Monocytes Absolute: 0.9 10*3/uL (ref 0.1–1.0)
Monocytes Relative: 5 %
Neutro Abs: 13 10*3/uL — ABNORMAL HIGH (ref 1.7–7.7)
Neutrophils Relative %: 64 %
Platelets: 193 10*3/uL (ref 150–400)
RBC: 3.72 MIL/uL — ABNORMAL LOW (ref 3.87–5.11)
RDW: 13.2 % (ref 11.5–15.5)
WBC: 20.2 10*3/uL — ABNORMAL HIGH (ref 4.0–10.5)
nRBC: 0 % (ref 0.0–0.2)

## 2021-05-29 LAB — COMPREHENSIVE METABOLIC PANEL
ALT: 28 U/L (ref 0–44)
AST: 27 U/L (ref 15–41)
Albumin: 3.7 g/dL (ref 3.5–5.0)
Alkaline Phosphatase: 101 U/L (ref 38–126)
Anion gap: 5 (ref 5–15)
BUN: 10 mg/dL (ref 6–20)
CO2: 25 mmol/L (ref 22–32)
Calcium: 8.8 mg/dL — ABNORMAL LOW (ref 8.9–10.3)
Chloride: 109 mmol/L (ref 98–111)
Creatinine, Ser: 0.71 mg/dL (ref 0.44–1.00)
GFR, Estimated: >60 mL/min (ref >60)
Glucose, Bld: 135 mg/dL — ABNORMAL HIGH (ref 70–99)
Potassium: 3.6 mmol/L (ref 3.5–5.1)
Sodium: 139 mmol/L (ref 135–145)
Total Bilirubin: 0.2 mg/dL — ABNORMAL LOW (ref 0.3–1.2)
Total Protein: 6.8 g/dL (ref 6.5–8.1)

## 2021-05-29 LAB — MAGNESIUM: Magnesium: 2 mg/dL (ref 1.7–2.4)

## 2021-05-29 MED ORDER — SODIUM CHLORIDE 0.9% FLUSH
10.0000 mL | INTRAVENOUS | Status: DC | PRN
  Administered 2021-05-29: 10 mL

## 2021-05-29 MED ORDER — SODIUM CHLORIDE 0.9 % IV SOLN
150.0000 mg | Freq: Once | INTRAVENOUS | Status: AC
  Administered 2021-05-29: 150 mg via INTRAVENOUS
  Filled 2021-05-29: qty 5

## 2021-05-29 MED ORDER — HEPARIN SOD (PORK) LOCK FLUSH 100 UNIT/ML IV SOLN
500.0000 [IU] | Freq: Once | INTRAVENOUS | Status: AC | PRN
  Administered 2021-05-29: 500 [IU]

## 2021-05-29 MED ORDER — PALONOSETRON HCL INJECTION 0.25 MG/5ML
0.2500 mg | Freq: Once | INTRAVENOUS | Status: AC
  Administered 2021-05-29: 0.25 mg via INTRAVENOUS
  Filled 2021-05-29: qty 5

## 2021-05-29 MED ORDER — SODIUM CHLORIDE 0.9 % IV SOLN
10.0000 mg | Freq: Once | INTRAVENOUS | Status: AC
  Administered 2021-05-29: 10 mg via INTRAVENOUS
  Filled 2021-05-29: qty 10

## 2021-05-29 MED ORDER — SODIUM CHLORIDE 0.9 % IV SOLN: Freq: Once | INTRAVENOUS | Status: AC

## 2021-05-29 MED ORDER — PEGFILGRASTIM 6 MG/0.6ML ~~LOC~~ PSKT
6.0000 mg | PREFILLED_SYRINGE | Freq: Once | SUBCUTANEOUS | Status: AC
  Administered 2021-05-29: 6 mg via SUBCUTANEOUS
  Filled 2021-05-29: qty 0.6

## 2021-05-29 MED ORDER — DOXORUBICIN HCL CHEMO IV INJECTION 2 MG/ML
60.0000 mg/m2 | Freq: Once | INTRAVENOUS | Status: AC
  Administered 2021-05-29: 114 mg via INTRAVENOUS
  Filled 2021-05-29: qty 57

## 2021-05-29 MED ORDER — SODIUM CHLORIDE 0.9 % IV SOLN
600.0000 mg/m2 | Freq: Once | INTRAVENOUS | Status: AC
  Administered 2021-05-29: 1140 mg via INTRAVENOUS
  Filled 2021-05-29: qty 57

## 2021-05-29 NOTE — Progress Notes (Signed)
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 Matt Holmes  NGS Results: not done  CURRENT THERAPY: ADJUVANT DOSE DENSE AC q14d / PACLitaxel q7d  BRIEF ONCOLOGIC HISTORY:  Oncology History  Invasive lobular carcinoma of left breast in female [facility])  04/11/2021 Initial Diagnosis   Invasive lobular carcinoma of left breast in female [facility])    05/01/2021 -  Chemotherapy   Patient is on Treatment Plan : BREAST ADJUVANT DOSE DENSE AC q14d / PACLitaxel q7d        CANCER STAGING:  Cancer Staging  Invasive lobular carcinoma of left breast in female [facility]) Staging form: Breast, AJCC 8th Edition - Clinical stage from 04/11/2021: Stage IIA (cT2, cN1, cM0, G2, ER+, PR+, HER2-) - Unsigned   INTERVAL HISTORY:  Debra Vaughn, a 46 y.o. female, returns for routine follow-up and consideration for next cycle of chemotherapy. Debra Vaughn was last seen on 05/01/2021.  Due for cycle #3 of AC today.   Overall, she tells me she has been feeling pretty well. She denies nausea. She reports anxiety 1 week following her last treatment. She reports depression in anticipation of her next treatment. She also reports fatigue. She has mouth sores for 1 week following her last treatment.  She denies fevers, chills, and weight loss. She has had to take a few days off of work due to her fatigue. She denies abdominal pain, skin rash, constipation, and diarrhea.   Overall, she feels ready for next cycle of chemo today.    REVIEW OF SYSTEMS:  Review of Systems  Constitutional:  Positive for fatigue. Negative for appetite change, chills, fever and unexpected weight change.  HENT:   Negative for mouth sores (resolved).    Gastrointestinal:  Negative for abdominal pain, constipation, diarrhea and nausea.  Skin:  Negative for rash.  Psychiatric/Behavioral:  Positive for depression and sleep disturbance. The patient is nervous/anxious.   All other systems reviewed and are negative.  PAST MEDICAL/SURGICAL HISTORY:  No past medical history on file. Past Surgical History:  Procedure Laterality Date   BREAST BIOPSY Right 04/06/2021    SOCIAL HISTORY:  Social History   Socioeconomic History   Marital status: Married    Spouse name: Not on file   Number of children: Not on file   Years of education: Not on file   Highest education level: Not on file  Occupational History   Not on file  Tobacco Use   Smoking status: Never   Smokeless tobacco: Never  Vaping Use   Vaping Use: Never used  Substance and Sexual Activity   Alcohol use: Not Currently    Comment: occasionally   Drug use: Never   Sexual activity: Not Currently  Other Topics Concern   Not on file  Social History Narrative   Not on file   Social Determinants of Health   Financial Resource Strain: Not on file  Food Insecurity: Not on file  Transportation Needs: Not on file  Physical Activity: Not on file  Stress: Not on file  Social Connections: Not on file  Intimate Partner Violence: Not on file    FAMILY HISTORY:  No family history on file.  CURRENT MEDICATIONS:  Current Outpatient  Medications  Medication Sig Dispense Refill   busPIRone (BUSPAR) 7.5 MG tablet Take 7.5 mg by mouth 3 (three) times daily.     CYCLOPHOSPHAMIDE IV Inject into the vein every 14 (fourteen) days.     DOXORUBICIN HCL IV Inject into the vein every 14 (fourteen) days.     FLUoxetine (PROZAC) 20 MG capsule Take 60 mg by mouth every morning.     Investigational dexamethasone 4 MG tablet [facility] 16070 Blister Card 2 Take 2 tablets by mouth daily. Take in the morning on Days 2-4. 6 tablet 0   Investigational olanzapine/placebo 10 MG capsule [facility] 16070 Blister  Card 2 Take 1 capsule by mouth daily. Take in the morning on Days 2-4. 3 capsule 0   Investigational prochlorperazine maleate/placebo 10 MG capsule [facility] 16070 Blister Card 2 Take 1 capsule by mouth every 8 (eight) hours. Take on Days 1-4. 11 capsule 0   levonorgestrel (MIRENA, 52 MG,) 20 MCG/DAY IUD by Intrauterine route.     lidocaine-prilocaine (EMLA) cream Apply a small amount to port a cath site and cover with plastic wrap 1 hour prior to infusion appointments 30 g 3   ondansetron (ZOFRAN) 8 MG tablet Take 1 tablet (8 mg total) by mouth every 8 (eight) hours as needed for nausea or vomiting. 20 tablet 1   prochlorperazine (COMPAZINE) 10 MG tablet Take 1 tablet (10 mg total) by mouth every 6 (six) hours as needed (Nausea or vomiting). 60 tablet 3   sucralfate (CARAFATE) 1 GM/10ML suspension Take 10 mLs (1 g total) by mouth 4 (four) times daily. Swish and swallow. 360 mL 2   No current facility-administered medications for this visit.   Facility-Administered Medications Ordered in Other Visits  Medication Dose Route Frequency Provider Last Rate Last Admin   sodium chloride flush (NS) 0.9 % injection 10 mL  10 mL Intravenous PRN Derek Jack, MD   10 mL at 05/08/21 1235    ALLERGIES:  Allergies  Allergen Reactions   Bupropion Hives    Other reaction(s): seizure    PHYSICAL EXAM:  Performance status (ECOG): 0 - Asymptomatic  There were no vitals filed for this visit. Wt Readings from Last 3 Encounters:  05/15/21 165 lb 12.8 oz (75.2 kg)  05/08/21 163 lb 12.8 oz (74.3 kg)  05/01/21 169 lb 1.6 oz (76.7 kg)   Physical Exam Vitals reviewed.  Constitutional:      Appearance: Normal appearance.  HENT:     Mouth/Throat:     Mouth: No oral lesions.  Cardiovascular:     Rate and Rhythm: Normal rate and regular rhythm.     Pulses: Normal pulses.     Heart sounds: Normal heart sounds.  Pulmonary:     Effort: Pulmonary effort is normal.     Breath sounds: Normal breath sounds.   Abdominal:     Palpations: Abdomen is soft. There is no mass.     Tenderness: There is no abdominal tenderness.  Neurological:     General: No focal deficit present.     Mental Status: She is alert and oriented to person, place, and time.  Psychiatric:        Mood and Affect: Mood normal.        Behavior: Behavior normal.    LABORATORY DATA:  I have reviewed the labs as listed.     Latest Ref Rng & Units 05/15/2021    9:34 AM 05/08/2021   10:35 AM 05/01/2021    7:52 AM  CBC  WBC  4.0 - 10.5 K/uL 14.3   1.5   10.4    Hemoglobin 12.0 - 15.0 g/dL 12.2   12.0   12.9    Hematocrit 36.0 - 46.0 % 35.9   36.2   38.6    Platelets 150 - 400 K/uL 205   167   302        Latest Ref Rng & Units 05/15/2021    9:34 AM 05/08/2021   10:35 AM 05/01/2021    7:52 AM  CMP  Glucose 70 - 99 mg/dL 106   89   105    BUN 6 - 20 mg/dL 11   12   12     Creatinine 0.44 - 1.00 mg/dL 0.71   0.57   0.58    Sodium 135 - 145 mmol/L 139   137   137    Potassium 3.5 - 5.1 mmol/L 3.6   4.1   3.9    Chloride 98 - 111 mmol/L 110   106   108    CO2 22 - 32 mmol/L 23   24   22     Calcium 8.9 - 10.3 mg/dL 9.0   9.2   9.0    Total Protein 6.5 - 8.1 g/dL 7.3   7.7   7.3    Total Bilirubin 0.3 - 1.2 mg/dL 0.4   0.5   0.6    Alkaline Phos 38 - 126 U/L 90   93   63    AST 15 - 41 U/L 18   14   18     ALT 0 - 44 U/L 15   14   13       DIAGNOSTIC IMAGING:  I have independently reviewed the scans and discussed with the patient. No results found.   ASSESSMENT:  Stage II T2N1 left breast invasive lobular carcinoma, ER/PR positive and HER2 negative: - She felt the lump in her left breast before Thanksgiving in 2022.  She missed mammograms for few years prior to that. - Mammogram was done in [facility]. - Left breast biopsy on 01/10/2021: Invasive lobular carcinoma, intermediate grade, ER/PR 90% positive, Ki-67 5 to 10% positive, HER2 negative - Left breast lumpectomy and SLNB on 02/01/2021 by Dr. Matt Holmes - Pathology:  Multifocal infiltrating lobular carcinoma, largest focus measuring 3.5 cm, multiple additional nodules present in the specimen particularly in the medial and lateral margins, grade 2, invasive tumor at the inked medial margin, 2/2 positive axillary lymph nodes, Ki-67 variable (40-50% with a number of tumor nodules stain and examined), ER/PR positive, HER2 negative.  Additional anterior medial margin was negative.  No tumor seen at the left breast deep margin tissue. 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 - MRI of the breast on 03/30/2021: Mildly prominent left subpectoral lymph node measuring 8 mm.  Solitary prominent left axillary lymph node.  5 mm mass in the upper inner quadrant of the right breast. - Right breast biopsy: Pseudoangiomatous stromal hyperplasia, blunt duct adenosis, no malignancy identified.  Left breast intramammary lymph node shows nodal tissue with pigmented histiocytes.  No malignancy. - Oncotype DX: RS-26.  Distal recurrence risk at 9 years on tamoxifen was 21%. - Dose dense AC started on 05/01/2021 - PET scan on 04/20/2021: Postsurgical changes in both breasts without metabolic activity for residual tumor.  Small focus of faint hypermetabolic activity within the left anterior chest wall involving the pectoralis muscle or subpectoral space without corresponding CT finding.  No evidence of distant metastatic disease.    Social/family history: - She lives in [place] with her husband.  She works as a Licensed conveyancer.  She is a non-smoker. - Mother had breast cancer at age 35.  Paternal grandmother had thyroid cancer.   PLAN:  Stage II T2N1 invasive lobular carcinoma of left breast: - She has completed 2 cycles of dose dense AC.  She did not have any nausea or vomiting after cycle 2. - Ported decreased energy levels.  She also had mouth sores for the first week. - Reviewed labs today which showed normal LFTs and CBC. - Proceed with  cycle 3 without any dose modifications. - RTC 2 weeks for follow-up for cycle 4. - Recommended discontinuation of Mirena.  We discussed nonhormonal options for contraception.  2.  Anxiety/depression: - Continue Prozac 60 mg daily. - Use BuSpar 3 times daily as needed.   Orders placed this encounter:  No orders of the defined types were placed in this encounter.    Derek Jack, MD [place] [phone]   I, Thana Ates, am acting as a scribe for Dr. Derek Jack.  I, Derek Jack MD, have reviewed the above documentation for accuracy and completeness, and I agree with the above.

## 2021-05-29 NOTE — Patient Instructions (Signed)
[facility]  Discharge Instructions: Thank you for choosing [facility] to provide your oncology and hematology care.  If you have a lab appointment with the Cancer Center, please come in thru the Main Entrance and check in at the main information desk.  Wear comfortable clothing and clothing appropriate for easy access to any Portacath or PICC line.   We strive to give you quality time with your provider. You may need to reschedule your appointment if you arrive late (15 or more minutes).  Arriving late affects you and other patients whose appointments are after yours.  Also, if you miss three or more appointments without notifying the office, you may be dismissed from the clinic at the provider's discretion.      For prescription refill requests, have your pharmacy contact our office and allow 72 hours for refills to be completed.    Today you received the following chemotherapy and/or immunotherapy agents    To help prevent nausea and vomiting after your treatment, we encourage you to take your nausea medication as directed.  BELOW ARE SYMPTOMS THAT SHOULD BE REPORTED IMMEDIATELY: . *FEVER GREATER THAN 100.4 F (38 C) OR HIGHER . *CHILLS OR SWEATING . *NAUSEA AND VOMITING THAT IS NOT CONTROLLED WITH YOUR NAUSEA MEDICATION . *UNUSUAL SHORTNESS OF BREATH . *UNUSUAL BRUISING OR BLEEDING . *URINARY PROBLEMS (pain or burning when urinating, or frequent urination) . *BOWEL PROBLEMS (unusual diarrhea, constipation, pain near the anus) . TENDERNESS IN MOUTH AND THROAT WITH OR WITHOUT PRESENCE OF ULCERS (sore throat, sores in mouth, or a toothache) . UNUSUAL RASH, SWELLING OR PAIN  . UNUSUAL VAGINAL DISCHARGE OR ITCHING   Items with * indicate a potential emergency and should be followed up as soon as possible or go to the Emergency Department if any problems should occur.  Please show the CHEMOTHERAPY ALERT CARD or IMMUNOTHERAPY ALERT CARD at check-in to the  Emergency Department and triage nurse.  Should you have questions after your visit or need to cancel or reschedule your appointment, please contact [facility] [phone]  and follow the prompts.  Office hours are 8:00 a.m. to 4:30 p.m. Monday - Friday. Please note that voicemails left after 4:00 p.m. may not be returned until the following business day.  We are closed weekends and major holidays. You have access to a nurse at all times for urgent questions. Please call the main number to the clinic [phone] and follow the prompts.  For any non-urgent questions, you may also contact your provider using MyChart. We now offer e-Visits for anyone 18 and older to request care online for non-urgent symptoms. For details visit [email or web address].   Also download the MyChart app! Go to the app store, search "MyChart", open the app, select [facility], and log in with your MyChart username and password.  Due to Covid, a mask is required upon entering the hospital/clinic. If you do not have a mask, one will be given to you upon arrival. For doctor visits, patients may have 1 support person aged 18 or older with them. For treatment visits, patients cannot have anyone with them due to current Covid guidelines and our immunocompromised population.  

## 2021-05-29 NOTE — Research (Signed)
[id number] - TREATMENT OF REFRACTORY NAUSEA   05/29/21  VISIT: Met with Debra Vaughn in an exam room after her port flush/labs along with Otilio Miu, RN, Research Nurse. Patient stated nausea was better, but still had a rough couple of weeks. Patient provided the Medication Diary and 4 Day Home Record for the above mentioned study. Patient prefers to complete all other questionnaires via e-mail. Patient returned study drug packet with one blue pill left (leftover medication due to timing of infusion on Day 1). This packet was given to Henreitta Leber in pharmacy and is going to be sent back to the main [facility] pharmacy at [facility]  Patient was thanked for her time, participation, and support of the above mentioned study.   Carol Ada, RT(R)(T) Clinical Research Coordinator

## 2021-05-29 NOTE — Progress Notes (Signed)
Port flushed with good blood return noted. No bruising or swelling at site. Labs drawn for treatment, per Research team, no additional labs needed today.

## 2021-05-29 NOTE — Patient Instructions (Signed)
[place] at [facility] Discharge Instructions   You were seen and examined today by Dr. Delton Coombes.  He reviewed the results of your lab work which is normal/stable.   We will proceed with your treatment today.   To help prevent mouth sores, you may use 1 tsp of baking soda and 1 tsp of salt in a quart of water. Rinse your mouth every hour while awake.   Return as scheduled in 2 weeks.    Thank you for choosing [place] at [facility] to provide your oncology and hematology care.  To afford each patient quality time with our provider, please arrive at least 15 minutes before your scheduled appointment time.   If you have a lab appointment with the [facility] please come in thru the Main Entrance and check in at the main information desk.  You need to re-schedule your appointment should you arrive 10 or more minutes late.  We strive to give you quality time with our providers, and arriving late affects you and other patients whose appointments are after yours.  Also, if you no show three or more times for appointments you may be dismissed from the clinic at the providers discretion.     Again, thank you for choosing [facility].  Our hope is that these requests will decrease the amount of time that you wait before being seen by our physicians.       _____________________________________________________________  Should you have questions after your visit to [facility], please contact our office at [phone] and follow the prompts.  Our office hours are 8:00 a.m. and 4:30 p.m. Monday - Friday.  Please note that voicemails left after 4:00 p.m. may not be returned until the following business day.  We are closed weekends and major holidays.  You do have access to a nurse 24-7, just call the main number to the clinic [phone] and do not press any options, hold on the line and a nurse will answer the phone.     For prescription refill requests, have your pharmacy contact our office and allow 72 hours.    Due to Covid, you will need to wear a mask upon entering the hospital. If you do not have a mask, a mask will be given to you at the Main Entrance upon arrival. For doctor visits, patients may have 1 support person age 53 or older with them. For treatment visits, patients can not have anyone with them due to social distancing guidelines and our immunocompromised population.

## 2021-05-29 NOTE — Progress Notes (Signed)
Labs reviewed today. Will proceed with treatment today per MD.   Treatment given per orders. Patient tolerated it well without problems. Vitals stable and discharged home from clinic ambulatory. Follow up as scheduled.  

## 2021-05-30 MED ORDER — OCTREOTIDE ACETATE 30 MG IM KIT
PACK | INTRAMUSCULAR | Status: AC
  Filled 2021-05-30: qty 1

## 2021-06-08 ENCOUNTER — Telehealth: Payer: Self-pay | Admitting: Radiology

## 2021-06-08 NOTE — Telephone Encounter (Addendum)
[id number] - TREATMENT OF REFRACTORY NAUSEA   06/08/21  PHONE CALL: LVM for patient to return call. Informed patient reason for call is to follow-up on questionnaires for the above mentioned study.   Carol Ada, RT(R)(T) Clinical Research Coordinator   PHONE CALL: Patient returned call and all questionnaires are now complete. Thanked patient for her time and support in the above mentioned study.   Carol Ada, RT(R)(T) Clinical Research Coordinator

## 2021-06-11 MED FILL — Dexamethasone Sodium Phosphate Inj 100 MG/10ML: INTRAMUSCULAR | Qty: 1 | Status: AC

## 2021-06-11 MED FILL — Fosaprepitant Dimeglumine For IV Infusion 150 MG (Base Eq): INTRAVENOUS | Qty: 5 | Status: AC

## 2021-06-12 ENCOUNTER — Inpatient Hospital Stay (HOSPITAL_COMMUNITY): Payer: BC Managed Care – PPO | Attending: Hematology | Admitting: Hematology

## 2021-06-12 ENCOUNTER — Inpatient Hospital Stay (HOSPITAL_COMMUNITY): Payer: BC Managed Care – PPO

## 2021-06-12 VITALS — BP 108/71 | HR 85 | Temp 97.1°F | Resp 18 | Ht 68.31 in | Wt 162.4 lb

## 2021-06-12 DIAGNOSIS — C50912 Malignant neoplasm of unspecified site of left female breast: Secondary | ICD-10-CM

## 2021-06-12 DIAGNOSIS — D649 Anemia, unspecified: Secondary | ICD-10-CM | POA: Insufficient documentation

## 2021-06-12 DIAGNOSIS — F419 Anxiety disorder, unspecified: Secondary | ICD-10-CM | POA: Diagnosis not present

## 2021-06-12 DIAGNOSIS — Z170 Estrogen receptor positive status [ER+]: Secondary | ICD-10-CM | POA: Diagnosis not present

## 2021-06-12 DIAGNOSIS — F32A Depression, unspecified: Secondary | ICD-10-CM | POA: Diagnosis not present

## 2021-06-12 DIAGNOSIS — Z5111 Encounter for antineoplastic chemotherapy: Secondary | ICD-10-CM | POA: Diagnosis present

## 2021-06-12 DIAGNOSIS — Z5189 Encounter for other specified aftercare: Secondary | ICD-10-CM | POA: Insufficient documentation

## 2021-06-12 LAB — COMPREHENSIVE METABOLIC PANEL
ALT: 17 U/L (ref 0–44)
AST: 17 U/L (ref 15–41)
Albumin: 3.9 g/dL (ref 3.5–5.0)
Alkaline Phosphatase: 113 U/L (ref 38–126)
Anion gap: 10 (ref 5–15)
BUN: 10 mg/dL (ref 6–20)
CO2: 24 mmol/L (ref 22–32)
Calcium: 9.2 mg/dL (ref 8.9–10.3)
Chloride: 107 mmol/L (ref 98–111)
Creatinine, Ser: 0.61 mg/dL (ref 0.44–1.00)
GFR, Estimated: >60 mL/min (ref >60)
Glucose, Bld: 90 mg/dL (ref 70–99)
Potassium: 4 mmol/L (ref 3.5–5.1)
Sodium: 141 mmol/L (ref 135–145)
Total Bilirubin: 0.3 mg/dL (ref 0.3–1.2)
Total Protein: 7 g/dL (ref 6.5–8.1)

## 2021-06-12 LAB — CBC WITH DIFFERENTIAL/PLATELET
Abs Immature Granulocytes: 4.49 10*3/uL — ABNORMAL HIGH (ref 0.00–0.07)
Basophils Absolute: 0.1 10*3/uL (ref 0.0–0.1)
Basophils Relative: 0 %
Eosinophils Absolute: 0 10*3/uL (ref 0.0–0.5)
Eosinophils Relative: 0 %
HCT: 30.8 % — ABNORMAL LOW (ref 36.0–46.0)
Hemoglobin: 10.4 g/dL — ABNORMAL LOW (ref 12.0–15.0)
Immature Granulocytes: 22 %
Lymphocytes Relative: 5 %
Lymphs Abs: 1 10*3/uL (ref 0.7–4.0)
MCH: 30.4 pg (ref 26.0–34.0)
MCHC: 33.8 g/dL (ref 30.0–36.0)
MCV: 90.1 fL (ref 80.0–100.0)
Monocytes Absolute: 1.2 10*3/uL — ABNORMAL HIGH (ref 0.1–1.0)
Monocytes Relative: 6 %
Neutro Abs: 13.6 10*3/uL — ABNORMAL HIGH (ref 1.7–7.7)
Neutrophils Relative %: 67 %
Platelets: 173 10*3/uL (ref 150–400)
RBC: 3.42 MIL/uL — ABNORMAL LOW (ref 3.87–5.11)
RDW: 14.5 % (ref 11.5–15.5)
WBC: 20.4 10*3/uL — ABNORMAL HIGH (ref 4.0–10.5)
nRBC: 0 % (ref 0.0–0.2)

## 2021-06-12 LAB — MAGNESIUM: Magnesium: 2.1 mg/dL (ref 1.7–2.4)

## 2021-06-12 MED ORDER — PEGFILGRASTIM 6 MG/0.6ML ~~LOC~~ PSKT
6.0000 mg | PREFILLED_SYRINGE | Freq: Once | SUBCUTANEOUS | Status: AC
  Administered 2021-06-12: 6 mg via SUBCUTANEOUS
  Filled 2021-06-12: qty 0.6

## 2021-06-12 MED ORDER — SODIUM CHLORIDE 0.9 % IV SOLN
600.0000 mg/m2 | Freq: Once | INTRAVENOUS | Status: AC
  Administered 2021-06-12: 1140 mg via INTRAVENOUS
  Filled 2021-06-12: qty 57

## 2021-06-12 MED ORDER — HEPARIN SOD (PORK) LOCK FLUSH 100 UNIT/ML IV SOLN
500.0000 [IU] | Freq: Once | INTRAVENOUS | Status: AC | PRN
  Administered 2021-06-12: 500 [IU]

## 2021-06-12 MED ORDER — SODIUM CHLORIDE 0.9 % IV SOLN
150.0000 mg | Freq: Once | INTRAVENOUS | Status: AC
  Administered 2021-06-12: 150 mg via INTRAVENOUS
  Filled 2021-06-12: qty 150

## 2021-06-12 MED ORDER — DOXORUBICIN HCL CHEMO IV INJECTION 2 MG/ML
60.0000 mg/m2 | Freq: Once | INTRAVENOUS | Status: AC
  Administered 2021-06-12: 114 mg via INTRAVENOUS
  Filled 2021-06-12: qty 57

## 2021-06-12 MED ORDER — SODIUM CHLORIDE 0.9% FLUSH
10.0000 mL | INTRAVENOUS | Status: DC | PRN
  Administered 2021-06-12: 10 mL

## 2021-06-12 MED ORDER — LIDOCAINE-PRILOCAINE 2.5-2.5 % EX CREA: TOPICAL_CREAM | CUTANEOUS | 3 refills | Status: DC

## 2021-06-12 MED ORDER — SODIUM CHLORIDE 0.9 % IV SOLN: Freq: Once | INTRAVENOUS | Status: AC

## 2021-06-12 MED ORDER — PALONOSETRON HCL INJECTION 0.25 MG/5ML
0.2500 mg | Freq: Once | INTRAVENOUS | Status: AC
  Administered 2021-06-12: 0.25 mg via INTRAVENOUS
  Filled 2021-06-12: qty 5

## 2021-06-12 MED ORDER — SODIUM CHLORIDE 0.9 % IV SOLN
10.0000 mg | Freq: Once | INTRAVENOUS | Status: AC
  Administered 2021-06-12: 10 mg via INTRAVENOUS
  Filled 2021-06-12: qty 10

## 2021-06-12 NOTE — Progress Notes (Signed)
Chaplain engaged in an initial visit, introducing herself and offering support.     06/12/21 1100  Clinical Encounter Type  Visited With Patient  Visit Type Initial

## 2021-06-12 NOTE — Progress Notes (Signed)
Patient tolerated chemotherapy with no complaints voiced.  Side effects with management reviewed with understanding verbalized.  Port site clean and dry with no bruising or swelling noted at site.  Good blood return noted before and after administration of chemotherapy.  Band aid applied.  Patient left in satisfactory condition with VSS and no s/s of distress noted.    Neulasta Onpro applied to left arm with green indicator light flashing.  Reviewed instructions with understanding verbalized.

## 2021-06-12 NOTE — Patient Instructions (Signed)
[place] at [facility] Discharge Instructions   You were seen and examined today by Dr. Delton Coombes.  He reviewed the results of your lab work which is normal/stable.   We will proceed with your treatment today.   Return as scheduled in 2 weeks to start weekly Taxol.    Thank you for choosing [place] at [facility] to provide your oncology and hematology care.  To afford each patient quality time with our provider, please arrive at least 15 minutes before your scheduled appointment time.   If you have a lab appointment with the [facility] please come in thru the Main Entrance and check in at the main information desk.  You need to re-schedule your appointment should you arrive 10 or more minutes late.  We strive to give you quality time with our providers, and arriving late affects you and other patients whose appointments are after yours.  Also, if you no show three or more times for appointments you may be dismissed from the clinic at the providers discretion.     Again, thank you for choosing [facility].  Our hope is that these requests will decrease the amount of time that you wait before being seen by our physicians.       _____________________________________________________________  Should you have questions after your visit to [facility], please contact our office at [phone] and follow the prompts.  Our office hours are 8:00 a.m. and 4:30 p.m. Monday - Friday.  Please note that voicemails left after 4:00 p.m. may not be returned until the following business day.  We are closed weekends and major holidays.  You do have access to a nurse 24-7, just call the main number to the clinic [phone] and do not press any options, hold on the line and a nurse will answer the phone.    For prescription refill requests, have your pharmacy contact our office and allow 72 hours.    Due to Covid, you will  need to wear a mask upon entering the hospital. If you do not have a mask, a mask will be given to you at the Main Entrance upon arrival. For doctor visits, patients may have 1 support person age 11 or older with them. For treatment visits, patients can not have anyone with them due to social distancing guidelines and our immunocompromised population.

## 2021-06-12 NOTE — Progress Notes (Signed)
Patients port flushed without difficulty.  Good blood return noted with no bruising or swelling noted at site.  Patient to remain accessed for treatment.  

## 2021-06-12 NOTE — Patient Instructions (Signed)
Debra Vaughn  Discharge Instructions: Thank you for choosing [facility] to provide your oncology and hematology care.  If you have a lab appointment with the [facility], please come in thru the Main Entrance and check in at the main information desk.  Wear comfortable clothing and clothing appropriate for easy access to any Portacath or PICC line.   We strive to give you quality time with your provider. You may need to reschedule your appointment if you arrive late (15 or more minutes).  Arriving late affects you and other patients whose appointments are after yours.  Also, if you miss three or more appointments without notifying the office, you may be dismissed from the clinic at the provider's discretion.      For prescription refill requests, have your pharmacy contact our office and allow 72 hours for refills to be completed.    Today you received the following chemotherapy and/or immunotherapy agents adriamycin, cytoxan.       To help prevent nausea and vomiting after your treatment, we encourage you to take your nausea medication as directed.  BELOW ARE SYMPTOMS THAT SHOULD BE REPORTED IMMEDIATELY: *FEVER GREATER THAN 100.4 F (38 C) OR HIGHER *CHILLS OR SWEATING *NAUSEA AND VOMITING THAT IS NOT CONTROLLED WITH YOUR NAUSEA MEDICATION *UNUSUAL SHORTNESS OF BREATH *UNUSUAL BRUISING OR BLEEDING *URINARY PROBLEMS (pain or burning when urinating, or frequent urination) *BOWEL PROBLEMS (unusual diarrhea, constipation, pain near the anus) TENDERNESS IN MOUTH AND THROAT WITH OR WITHOUT PRESENCE OF ULCERS (sore throat, sores in mouth, or a toothache) UNUSUAL RASH, SWELLING OR PAIN  UNUSUAL VAGINAL DISCHARGE OR ITCHING   Items with * indicate a potential emergency and should be followed up as soon as possible or go to the Emergency Department if any problems should occur.  Please show the CHEMOTHERAPY ALERT CARD or IMMUNOTHERAPY ALERT CARD at check-in to the  Emergency Department and triage nurse.  Should you have questions after your visit or need to cancel or reschedule your appointment, please contact [facility] [phone]  and follow the prompts.  Office hours are 8:00 a.m. to 4:30 p.m. Monday - Friday. Please note that voicemails left after 4:00 p.m. may not be returned until the following business day.  We are closed weekends and major holidays. You have access to a nurse at all times for urgent questions. Please call the main number to the clinic [phone] and follow the prompts.  For any non-urgent questions, you may also contact your provider using MyChart. We now offer e-Visits for anyone 86 and older to request care online for non-urgent symptoms. For details visit [email or web address].   Also download the MyChart app! Go to the app store, search "MyChart", open the app, select [place] [facility], and log in with your MyChart username and password.  Due to Covid, a mask is required upon entering the hospital/clinic. If you do not have a mask, one will be given to you upon arrival. For doctor visits, patients may have 1 support person aged 64 or older with them. For treatment visits, patients cannot have anyone with them due to current Covid guidelines and our immunocompromised population.

## 2021-06-12 NOTE — Progress Notes (Signed)
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 Matt Holmes  NGS Results: not done  CURRENT THERAPY: ADJUVANT DOSE DENSE AC q14d / PACLitaxel q7d  BRIEF ONCOLOGIC HISTORY:  Oncology History  Invasive lobular carcinoma of left breast in female [facility])  04/11/2021 Initial Diagnosis   Invasive lobular carcinoma of left breast in female [facility])    05/01/2021 -  Chemotherapy   Patient is on Treatment Plan : BREAST ADJUVANT DOSE DENSE AC q14d / PACLitaxel q7d        CANCER STAGING:  Cancer Staging  Invasive lobular carcinoma of left breast in female [facility]) Staging form: Breast, AJCC 8th Edition - Clinical stage from 04/11/2021: Stage IIA (cT2, cN1, cM0, G2, ER+, PR+, HER2-) - Unsigned   INTERVAL HISTORY:  [name], a 46 y.o. female, returns for routine follow-up and consideration for next cycle of chemotherapy. Debra Vaughn was last seen on 05/29/2021.  Due for cycle #4 of AC today.   Overall, she tells me she has been feeling pretty well. She reports improved anxiety and continued depression. She reports mild nausea and denies vomiting, and she only had to take 1 tablet of Compazine since her last treatment. She reports Compazine did not help her nausea. She denies diarrhea and constipation. She reports occasional dizziness upon standing. She denies mouth sores and orthopnea.   Overall, she feels ready for next cycle of chemo today.   REVIEW OF SYSTEMS:  Review of Systems  Constitutional:  Positive for appetite change and fatigue.  HENT:   Negative for mouth sores.   Gastrointestinal:  Positive for nausea. Negative for constipation, diarrhea and vomiting.  Neurological:  Positive for  dizziness.  Psychiatric/Behavioral:  Positive for depression. The patient is nervous/anxious (improved).   All other systems reviewed and are negative.  PAST MEDICAL/SURGICAL HISTORY:  No past medical history on file. Past Surgical History:  Procedure Laterality Date   BREAST BIOPSY Right 04/06/2021    SOCIAL HISTORY:  Social History   Socioeconomic History   Marital status: Married    Spouse name: Not on file   Number of children: Not on file   Years of education: Not on file   Highest education level: Not on file  Occupational History   Not on file  Tobacco Use   Smoking status: Never   Smokeless tobacco: Never  Vaping Use   Vaping Use: Never used  Substance and Sexual Activity   Alcohol use: Not Currently    Comment: occasionally   Drug use: Never   Sexual activity: Not Currently  Other Topics Concern   Not on file  Social History Narrative   Not on file   Social Determinants of Health   Financial Resource Strain: Not on file  Food Insecurity: Not on file  Transportation Needs: Not on file  Physical Activity: Not on file  Stress: Not on file  Social Connections: Not on file  Intimate Partner Violence: Not on file    FAMILY HISTORY:  No family history on file.  CURRENT MEDICATIONS:  Current Outpatient Medications  Medication Sig Dispense Refill   busPIRone (BUSPAR) 7.5 MG tablet Take 7.5 mg by mouth 3 (three) times daily.  CYCLOPHOSPHAMIDE IV Inject into the vein every 14 (fourteen) days.     DOXORUBICIN HCL IV Inject into the vein every 14 (fourteen) days.     FLUoxetine (PROZAC) 20 MG capsule Take 60 mg by mouth every morning.     Investigational dexamethasone 4 MG tablet [facility] 16070 Blister Card 2 Take 2 tablets by mouth daily. Take in the morning on Days 2-4. 6 tablet 0   Investigational olanzapine/placebo 10 MG capsule [facility] 16070 Blister Card 2 Take 1 capsule by mouth daily. Take in the morning on Days 2-4. 3 capsule 0   Investigational  prochlorperazine maleate/placebo 10 MG capsule [facility] 16070 Blister Card 2 Take 1 capsule by mouth every 8 (eight) hours. Take on Days 1-4. 11 capsule 0   levonorgestrel (MIRENA, 52 MG,) 20 MCG/DAY IUD by Intrauterine route.     lidocaine-prilocaine (EMLA) cream Apply a small amount to port a cath site and cover with plastic wrap 1 hour prior to infusion appointments (Patient not taking: Reported on 05/29/2021) 30 g 3   prochlorperazine (COMPAZINE) 10 MG tablet Take 1 tablet (10 mg total) by mouth every 6 (six) hours as needed (Nausea or vomiting). (Patient not taking: Reported on 05/29/2021) 60 tablet 3   sucralfate (CARAFATE) 1 GM/10ML suspension Take 10 mLs (1 g total) by mouth 4 (four) times daily. Swish and swallow. 360 mL 2   No current facility-administered medications for this visit.   Facility-Administered Medications Ordered in Other Visits  Medication Dose Route Frequency Provider Last Rate Last Admin   octreotide (SANDOSTATIN LAR) 30 MG IM injection            sodium chloride flush (NS) 0.9 % injection 10 mL  10 mL Intravenous PRN Derek Jack, MD   10 mL at 05/08/21 1235    ALLERGIES:  Allergies  Allergen Reactions   Bupropion Hives    Other reaction(s): seizure    PHYSICAL EXAM:  Performance status (ECOG): 0 - Asymptomatic  There were no vitals filed for this visit. Wt Readings from Last 3 Encounters:  05/29/21 165 lb 12.8 oz (75.2 kg)  05/15/21 165 lb 12.8 oz (75.2 kg)  05/08/21 163 lb 12.8 oz (74.3 kg)   Physical Exam Vitals reviewed.  Constitutional:      Appearance: Normal appearance.  Cardiovascular:     Rate and Rhythm: Normal rate and regular rhythm.     Pulses: Normal pulses.     Heart sounds: Normal heart sounds.  Pulmonary:     Effort: Pulmonary effort is normal.     Breath sounds: Normal breath sounds.  Abdominal:     Palpations: Abdomen is soft. There is no mass.     Tenderness: There is no abdominal tenderness.  Musculoskeletal:     Right  lower leg: No edema.     Left lower leg: No edema.  Neurological:     General: No focal deficit present.     Mental Status: She is alert and oriented to person, place, and time.  Psychiatric:        Mood and Affect: Mood normal.        Behavior: Behavior normal.    LABORATORY DATA:  I have reviewed the labs as listed.     Latest Ref Rng & Units 05/29/2021    8:19 AM 05/15/2021    9:34 AM 05/08/2021   10:35 AM  CBC  WBC 4.0 - 10.5 K/uL 20.2   14.3   1.5    Hemoglobin 12.0 - 15.0 g/dL  11.4   12.2   12.0    Hematocrit 36.0 - 46.0 % 33.2   35.9   36.2    Platelets 150 - 400 K/uL 193   205   167        Latest Ref Rng & Units 05/29/2021    8:19 AM 05/15/2021    9:34 AM 05/08/2021   10:35 AM  CMP  Glucose 70 - 99 mg/dL 135   106   89    BUN 6 - 20 mg/dL 10   11   12     Creatinine 0.44 - 1.00 mg/dL 0.71   0.71   0.57    Sodium 135 - 145 mmol/L 139   139   137    Potassium 3.5 - 5.1 mmol/L 3.6   3.6   4.1    Chloride 98 - 111 mmol/L 109   110   106    CO2 22 - 32 mmol/L 25   23   24     Calcium 8.9 - 10.3 mg/dL 8.8   9.0   9.2    Total Protein 6.5 - 8.1 g/dL 6.8   7.3   7.7    Total Bilirubin 0.3 - 1.2 mg/dL 0.2   0.4   0.5    Alkaline Phos 38 - 126 U/L 101   90   93    AST 15 - 41 U/L 27   18   14     ALT 0 - 44 U/L 28   15   14       DIAGNOSTIC IMAGING:  I have independently reviewed the scans and discussed with the patient. No results found.   ASSESSMENT:  Stage II T2N1 left breast invasive lobular carcinoma, ER/PR positive and HER2 negative: - She felt the lump in her left breast before Thanksgiving in 2022.  She missed mammograms for few years prior to that. - Mammogram was done in [facility]. - Left breast biopsy on 01/10/2021: Invasive lobular carcinoma, intermediate grade, ER/PR 90% positive, Ki-67 5 to 10% positive, HER2 negative - Left breast lumpectomy and SLNB on 02/01/2021 by Dr. Matt Holmes - Pathology: Multifocal infiltrating lobular carcinoma, largest focus measuring  3.5 cm, multiple additional nodules present in the specimen particularly in the medial and lateral margins, grade 2, invasive tumor at the inked medial margin, 2/2 positive axillary lymph nodes, Ki-67 variable (40-50% with a number of tumor nodules stain and examined), ER/PR positive, HER2 negative.  Additional anterior medial margin was negative.  No tumor seen at the left breast deep margin tissue. - Patient reportedly had genetic testing done and was told BRCA -7 years ago in [place]. - MRI of the breast on 03/30/2021: Mildly prominent left subpectoral lymph node measuring 8 mm.  Solitary prominent left axillary lymph node.  5 mm mass in the upper inner quadrant of the right breast. - Right breast biopsy: Pseudoangiomatous stromal hyperplasia, blunt duct adenosis, no malignancy identified.  Left breast intramammary lymph node shows nodal tissue with pigmented histiocytes.  No malignancy. - Oncotype DX: RS-26.  Distal recurrence risk at 9 years on tamoxifen was 21%. - Dose dense AC started on 05/01/2021 - PET scan on 04/20/2021: Postsurgical changes in both breasts without metabolic activity for residual tumor.  Small focus of faint hypermetabolic activity within the left anterior chest wall involving the pectoralis muscle or subpectoral space without corresponding CT finding.  No evidence of distant metastatic disease.    Social/family history: - She lives in [place] with her  husband.  She works as a Licensed conveyancer.  She is a non-smoker. - Mother had breast cancer at age 3.  Paternal grandmother had thyroid cancer.   PLAN:  Stage II T2N1 invasive lobular carcinoma of left breast: - She has completed third cycle of dose dense AC. - She had mild nausea but denied any vomiting.  She had to take Compazine once. - No other GI symptoms.  She does report occasional dizziness.  Blood pressure today is 116/72. - Reviewed labs today which showed normal LFTs and creatinine.  CBC shows elevated white count  secondary to G-CSF.  Platelets are normal. - Proceed with cycle 4 today.  RTC 2 weeks for follow-up with repeat labs.  I plan to start weekly paclitaxel at that time.  2.  Anxiety/depression: - Continue Prozac 60 mg daily and use BuSpar 3 times daily as needed.   Orders placed this encounter:  No orders of the defined types were placed in this encounter.    Derek Jack, MD [facility] [phone]   I, Thana Ates, am acting as a scribe for Dr. Derek Jack.  I, Derek Jack MD, have reviewed the above documentation for accuracy and completeness, and I agree with the above.

## 2021-06-16 ENCOUNTER — Other Ambulatory Visit: Payer: Self-pay | Admitting: Nurse Practitioner

## 2021-06-27 ENCOUNTER — Inpatient Hospital Stay (HOSPITAL_BASED_OUTPATIENT_CLINIC_OR_DEPARTMENT_OTHER): Payer: BC Managed Care – PPO | Admitting: Hematology

## 2021-06-27 ENCOUNTER — Inpatient Hospital Stay (HOSPITAL_COMMUNITY): Payer: BC Managed Care – PPO

## 2021-06-27 ENCOUNTER — Encounter (HOSPITAL_COMMUNITY): Payer: Self-pay | Admitting: Hematology

## 2021-06-27 VITALS — BP 113/65 | HR 78 | Temp 99.0°F | Resp 17 | Ht 68.31 in | Wt 162.0 lb

## 2021-06-27 DIAGNOSIS — C50912 Malignant neoplasm of unspecified site of left female breast: Secondary | ICD-10-CM | POA: Diagnosis not present

## 2021-06-27 DIAGNOSIS — Z5111 Encounter for antineoplastic chemotherapy: Secondary | ICD-10-CM | POA: Diagnosis not present

## 2021-06-27 LAB — CBC WITH DIFFERENTIAL/PLATELET
Abs Immature Granulocytes: 3.04 10*3/uL — ABNORMAL HIGH (ref 0.00–0.07)
Basophils Absolute: 0.1 10*3/uL (ref 0.0–0.1)
Basophils Relative: 1 %
Eosinophils Absolute: 0 10*3/uL (ref 0.0–0.5)
Eosinophils Relative: 0 %
HCT: 27.5 % — ABNORMAL LOW (ref 36.0–46.0)
Hemoglobin: 9.2 g/dL — ABNORMAL LOW (ref 12.0–15.0)
Immature Granulocytes: 19 %
Lymphocytes Relative: 6 %
Lymphs Abs: 0.9 10*3/uL (ref 0.7–4.0)
MCH: 30.9 pg (ref 26.0–34.0)
MCHC: 33.5 g/dL (ref 30.0–36.0)
MCV: 92.3 fL (ref 80.0–100.0)
Monocytes Absolute: 0.9 10*3/uL (ref 0.1–1.0)
Monocytes Relative: 6 %
Neutro Abs: 11.4 10*3/uL — ABNORMAL HIGH (ref 1.7–7.7)
Neutrophils Relative %: 68 %
Platelets: 233 10*3/uL (ref 150–400)
RBC: 2.98 MIL/uL — ABNORMAL LOW (ref 3.87–5.11)
RDW: 17.2 % — ABNORMAL HIGH (ref 11.5–15.5)
WBC: 16.4 10*3/uL — ABNORMAL HIGH (ref 4.0–10.5)
nRBC: 0.1 % (ref 0.0–0.2)

## 2021-06-27 LAB — COMPREHENSIVE METABOLIC PANEL WITH GFR
ALT: 35 U/L (ref 0–44)
AST: 37 U/L (ref 15–41)
Albumin: 3.8 g/dL (ref 3.5–5.0)
Alkaline Phosphatase: 104 U/L (ref 38–126)
Anion gap: 6 (ref 5–15)
BUN: 11 mg/dL (ref 6–20)
CO2: 25 mmol/L (ref 22–32)
Calcium: 8.9 mg/dL (ref 8.9–10.3)
Chloride: 108 mmol/L (ref 98–111)
Creatinine, Ser: 0.63 mg/dL (ref 0.44–1.00)
GFR, Estimated: >60 mL/min
Glucose, Bld: 116 mg/dL — ABNORMAL HIGH (ref 70–99)
Potassium: 3.8 mmol/L (ref 3.5–5.1)
Sodium: 139 mmol/L (ref 135–145)
Total Bilirubin: 0.5 mg/dL (ref 0.3–1.2)
Total Protein: 6.9 g/dL (ref 6.5–8.1)

## 2021-06-27 LAB — MAGNESIUM: Magnesium: 2 mg/dL (ref 1.7–2.4)

## 2021-06-27 MED ORDER — SODIUM CHLORIDE 0.9 % IV SOLN
80.0000 mg/m2 | Freq: Once | INTRAVENOUS | Status: AC
  Administered 2021-06-27: 150 mg via INTRAVENOUS
  Filled 2021-06-27: qty 25

## 2021-06-27 MED ORDER — HEPARIN SOD (PORK) LOCK FLUSH 100 UNIT/ML IV SOLN
500.0000 [IU] | Freq: Once | INTRAVENOUS | Status: AC | PRN
  Administered 2021-06-27: 500 [IU]

## 2021-06-27 MED ORDER — SODIUM CHLORIDE 0.9 % IV SOLN
10.0000 mg | Freq: Once | INTRAVENOUS | Status: AC
  Administered 2021-06-27: 10 mg via INTRAVENOUS
  Filled 2021-06-27: qty 10

## 2021-06-27 MED ORDER — FAMOTIDINE IN NACL 20-0.9 MG/50ML-% IV SOLN
20.0000 mg | Freq: Once | INTRAVENOUS | Status: AC
  Administered 2021-06-27: 20 mg via INTRAVENOUS
  Filled 2021-06-27: qty 50

## 2021-06-27 MED ORDER — DIPHENHYDRAMINE HCL 50 MG/ML IJ SOLN
50.0000 mg | Freq: Once | INTRAMUSCULAR | Status: AC
  Administered 2021-06-27: 50 mg via INTRAVENOUS
  Filled 2021-06-27: qty 1

## 2021-06-27 MED ORDER — SODIUM CHLORIDE 0.9% FLUSH
10.0000 mL | INTRAVENOUS | Status: DC | PRN
  Administered 2021-06-27: 10 mL

## 2021-06-27 MED ORDER — SODIUM CHLORIDE 0.9 % IV SOLN: Freq: Once | INTRAVENOUS | Status: AC

## 2021-06-27 NOTE — Patient Instructions (Addendum)
[place] at [facility] Discharge Instructions   You were seen and examined today by Dr. Delton Coombes.  He reviewed the results of your lab work which are normal/stable.   We will proceed with your first infusion of Taxol today. This chemotherapy is not nearly as hard on you as the previous drugs.   Return as scheduled.    Thank you for choosing [place] at [facility] to provide your oncology and hematology care.  To afford each patient quality time with our provider, please arrive at least 15 minutes before your scheduled appointment time.   If you have a lab appointment with the [facility] please come in thru the Main Entrance and check in at the main information desk.  You need to re-schedule your appointment should you arrive 10 or more minutes late.  We strive to give you quality time with our providers, and arriving late affects you and other patients whose appointments are after yours.  Also, if you no show three or more times for appointments you may be dismissed from the clinic at the providers discretion.     Again, thank you for choosing [facility].  Our hope is that these requests will decrease the amount of time that you wait before being seen by our physicians.       _____________________________________________________________  Should you have questions after your visit to [facility], please contact our office at [phone] and follow the prompts.  Our office hours are 8:00 a.m. and 4:30 p.m. Monday - Friday.  Please note that voicemails left after 4:00 p.m. may not be returned until the following business day.  We are closed weekends and major holidays.  You do have access to a nurse 24-7, just call the main number to the clinic [phone] and do not press any options, hold on the line and a nurse will answer the phone.    For prescription refill requests, have your pharmacy contact our office  and allow 72 hours.    Due to Covid, you will need to wear a mask upon entering the hospital. If you do not have a mask, a mask will be given to you at the Main Entrance upon arrival. For doctor visits, patients may have 1 support person age 70 or older with them. For treatment visits, patients can not have anyone with them due to social distancing guidelines and our immunocompromised population.

## 2021-06-27 NOTE — Progress Notes (Signed)
Debra Vaughn, [place] [id number]   CLINIC:  Medical Oncology/Hematology  PCP:  Jacqualine Code, [address] / [place] [address] [phone]   REASON FOR VISIT:  Follow-up for stage II T2N1 invasive lobular carcinoma of left breast  PRIOR THERAPY: Left breast lumpectomy and SLNB on 02/01/2021 by Dr. Matt Holmes  NGS Results: not done  CURRENT THERAPY: ADJUVANT DOSE DENSE AC q14d / PACLitaxel q7d  BRIEF ONCOLOGIC HISTORY:  Oncology History  Invasive lobular carcinoma of left breast in female [facility])  04/11/2021 Initial Diagnosis   Invasive lobular carcinoma of left breast in female [facility])   05/01/2021 -  Chemotherapy   Patient is on Treatment Plan : BREAST ADJUVANT DOSE DENSE AC q14d / PACLitaxel q7d       CANCER STAGING:  Cancer Staging  Invasive lobular carcinoma of left breast in female [facility]) Staging form: Breast, AJCC 8th Edition - Clinical stage from 04/11/2021: Stage IIA (cT2, cN1, cM0, G2, ER+, PR+, HER2-) - Unsigned   INTERVAL HISTORY:  [name], a 46 y.o. female, returns for routine follow-up and consideration for next cycle of chemotherapy. Debra Vaughn was last seen on 06/12/2021.  Due for cycle #1 of Taxol today.   Overall, she tells me she has been feeling pretty well. Her appetite is poor, and she reports mild constipation and nausea. She reports indigestion which made it difficult to eat, but her eating has improved over the past week. She reports spots of discoloration on her hands. She also reports sore spots on her scalp. She reports sinus congestions, and she reports one episode of clear rhinorrhea. She also reports headaches. She was placed on 1 course of Amoxicillin which she will complete in 2-3 days, and she denies diarrhea. Her weight has been stable since 6/6. She denies mouth sores.   Overall, she feels ready for next cycle of chemo today.    REVIEW OF SYSTEMS:  Review of Systems  Constitutional:  Positive  for appetite change. Negative for fatigue and unexpected weight change.  HENT:   Negative for mouth sores.   Respiratory:  Positive for shortness of breath (w/ exertion).   Gastrointestinal:  Positive for blood in stool (x1 w/ hard stool), constipation and nausea. Negative for diarrhea.  Neurological:  Positive for headaches.  Psychiatric/Behavioral:  Positive for depression. The patient is nervous/anxious.   All other systems reviewed and are negative.   PAST MEDICAL/SURGICAL HISTORY:  No past medical history on file. Past Surgical History:  Procedure Laterality Date   BREAST BIOPSY Right 04/06/2021    SOCIAL HISTORY:  Social History   Socioeconomic History   Marital status: Married    Spouse name: Not on file   Number of children: Not on file   Years of education: Not on file   Highest education level: Not on file  Occupational History   Not on file  Tobacco Use   Smoking status: Never   Smokeless tobacco: Never  Vaping Use   Vaping Use: Never used  Substance and Sexual Activity   Alcohol use: Not Currently    Comment: occasionally   Drug use: Never   Sexual activity: Not Currently  Other Topics Concern   Not on file  Social History Narrative   Not on file   Social Determinants of Health   Financial Resource Strain: Not on file  Food Insecurity: Not on file  Transportation Needs: Not on file  Physical Activity: Not on file  Stress: Not on file  Social Connections: Not on file  Intimate Partner Violence: Not on file    FAMILY HISTORY:  No family history on file.  CURRENT MEDICATIONS:  Current Outpatient Medications  Medication Sig Dispense Refill   busPIRone (BUSPAR) 7.5 MG tablet Take 7.5 mg by mouth 3 (three) times daily.     CYCLOPHOSPHAMIDE IV Inject into the vein every 14 (fourteen) days.     DOXORUBICIN HCL IV Inject into the vein every 14 (fourteen) days.     FLUoxetine (PROZAC) 20 MG capsule Take 60 mg by mouth every morning.     Investigational  dexamethasone 4 MG tablet [facility] 16070 Blister Card 2 Take 2 tablets by mouth daily. Take in the morning on Days 2-4. 6 tablet 0   Investigational olanzapine/placebo 10 MG capsule [facility]RCC 16070 Blister Card 2 Take 1 capsule by mouth daily. Take in the morning on Days 2-4. 3 capsule 0   Investigational prochlorperazine maleate/placebo 10 MG capsule URCC 16070 Blister Card 2 Take 1 capsule by mouth every 8 (eight) hours. Take on Days 1-4. 11 capsule 0   levonorgestrel (MIRENA, 52 MG,) 20 MCG/DAY IUD by Intrauterine route.     lidocaine-prilocaine (EMLA) cream Apply a small amount to port a cath site and cover with plastic wrap 1 hour prior to infusion appointments (Patient not taking: Reported on 06/12/2021) 30 g 3   lidocaine-prilocaine (EMLA) cream Apply to affected area once 30 g 3   prochlorperazine (COMPAZINE) 10 MG tablet Take 1 tablet (10 mg total) by mouth every 6 (six) hours as needed (Nausea or vomiting). (Patient not taking: Reported on 06/12/2021) 60 tablet 3   sucralfate (CARAFATE) 1 GM/10ML suspension Take 10 mLs (1 g total) by mouth 4 (four) times daily. Swish and swallow. 360 mL 2   No current facility-administered medications for this visit.   Facility-Administered Medications Ordered in Other Visits  Medication Dose Route Frequency Provider Last Rate Last Admin   octreotide (SANDOSTATIN LAR) 30 MG IM injection            sodium chloride flush (NS) 0.9 % injection 10 mL  10 mL Intravenous PRN Derek Jack, MD   10 mL at 05/08/21 1235    ALLERGIES:  Allergies  Allergen Reactions   Bupropion Hives    Other reaction(s): seizure    PHYSICAL EXAM:  Performance status (ECOG): 0 - Asymptomatic  There were no vitals filed for this visit. Wt Readings from Last 3 Encounters:  06/12/21 162 lb 6.4 oz (73.7 kg)  05/29/21 165 lb 12.8 oz (75.2 kg)  05/15/21 165 lb 12.8 oz (75.2 kg)   Physical Exam Vitals reviewed.  Constitutional:      Appearance: Normal appearance.   Cardiovascular:     Rate and Rhythm: Normal rate and regular rhythm.     Pulses: Normal pulses.     Heart sounds: Normal heart sounds.  Pulmonary:     Effort: Pulmonary effort is normal.     Breath sounds: Normal breath sounds.  Neurological:     General: No focal deficit present.     Mental Status: She is alert and oriented to person, place, and time.  Psychiatric:        Mood and Affect: Mood normal.        Behavior: Behavior normal.     LABORATORY DATA:  I have reviewed the labs as listed.     Latest Ref Rng & Units 06/12/2021    9:08 AM 05/29/2021  8:19 AM 05/15/2021    9:34 AM  CBC  WBC 4.0 - 10.5 K/uL 20.4  20.2  14.3   Hemoglobin 12.0 - 15.0 g/dL 10.4  11.4  12.2   Hematocrit 36.0 - 46.0 % 30.8  33.2  35.9   Platelets 150 - 400 K/uL 173  193  205       Latest Ref Rng & Units 06/12/2021    9:08 AM 05/29/2021    8:19 AM 05/15/2021    9:34 AM  CMP  Glucose 70 - 99 mg/dL 90  135  106   BUN 6 - 20 mg/dL _0 Creatinine 0.44 - 1.00 mg/dL 0.61  0.71  0.71   Sodium 135 - 145 mmol/L 141  139  139   Potassium 3.5 - 5.1 mmol/L 4.0  3.6  3.6   Chloride 98 - 111 mmol/L 107  109  110   CO2 22 - 32 mmol/L _1 Calcium 8.9 - 10.3 mg/dL 9.2  8.8  9.0   Total Protein 6.5 - 8.1 g/dL 7.0  6.8  7.3   Total Bilirubin 0.3 - 1.2 mg/dL 0.3  0.2  0.4   Alkaline Phos 38 - 126 U/L 113  101  90   AST 15 - 41 U/L _2 ALT 0 - 44 U/L _3 DIAGNOSTIC IMAGING:  I have independently reviewed the scans and discussed with the patient. No results found.   ASSESSMENT:  Stage II T2N1 left breast invasive lobular carcinoma, ER/PR positive and HER2 negative: - She felt the lump in her left breast before Thanksgiving in 2022.  She missed mammograms for few years prior to that. - Mammogram was done in [facility]. - Left breast biopsy on 01/10/2021: Invasive lobular carcinoma, intermediate grade, ER/PR 90% positive, Ki-67 5 to 10% positive, HER2 negative -  Left breast lumpectomy and SLNB on 02/01/2021 by Dr. Matt Holmes - Pathology: Multifocal infiltrating lobular carcinoma, largest focus measuring 3.5 cm, multiple additional nodules present in the specimen particularly in the medial and lateral margins, grade 2, invasive tumor at the inked medial margin, 2/2 positive axillary lymph nodes, Ki-67 variable (40-50% with a number of tumor nodules stain and examined), ER/PR positive, HER2 negative.  Additional anterior medial margin was negative.  No tumor seen at the left breast deep margin tissue. - Patient reportedly had genetic testing done and was told BRCA -7 years ago in [place]. - MRI of the breast on 03/30/2021: Mildly prominent left subpectoral lymph node measuring 8 mm.  Solitary prominent left axillary lymph node.  5 mm mass in the upper inner quadrant of the right breast. - Right breast biopsy: Pseudoangiomatous stromal hyperplasia, blunt duct adenosis, no malignancy identified.  Left breast intramammary lymph node shows nodal tissue with pigmented histiocytes.  No malignancy. - Oncotype DX: [id number].  Distal recurrence risk at 9 years on tamoxifen was 21%. - Dose dense AC started on 05/01/2021 - PET scan on 04/20/2021: Postsurgical changes in both breasts without metabolic activity for residual tumor.  Small focus of faint hypermetabolic activity within the left anterior chest wall involving the pectoralis muscle or subpectoral space without corresponding CT finding.  No evidence of distant metastatic disease.    Social/family history: - She lives in [place] with her husband.  She works as a Licensed conveyancer.  She is a non-smoker. - Mother had breast cancer at age  81.  Paternal grandmother had thyroid cancer.   PLAN:  Stage II T2N1 invasive lobular carcinoma of left breast: - She has completed 4 cycles of dose dense AC. - She has some fatigue which is improving.  Shortness of breath on activity was reported.  Mild discoloration of the palms from chemotherapy  noted. - She had a sinus infection diagnosed by her dentist and currently finishing amoxicillin. - Reviewed labs today which showed normal LFTs and creatinine.  CBC was grossly normal with anemia from myelosuppression. - Discussed starting weekly paclitaxel x12 weeks.  Discussed side effects in detail. - She will proceed with week 1 today.  I will see her back in 2 weeks prior to week 3.  If she is stable I will switch her to every 3 weeks.  2.  Anxiety/depression: - Continue Prozac 60 mg daily and use BuSpar 3 times daily as needed.   Orders placed this encounter:  No orders of the defined types were placed in this encounter.    Derek Jack, MD [place] [phone]   I, Thana Ates, am acting as a scribe for Dr. Derek Jack.  I, Derek Jack MD, have reviewed the above documentation for accuracy and completeness, and I agree with the above.

## 2021-06-28 NOTE — Progress Notes (Signed)
24 hour follow up . Patient stated she had some fatigue, flushed face. Patient states she know the fatigue is normal , informed the flushed feeling was probably from the steroid she got prior to treatment yesterday.   Patient stated she otherwise felt good. She has the clinic numbers to call if needed.

## 2021-07-03 MED FILL — Dexamethasone Sodium Phosphate Inj 100 MG/10ML: INTRAMUSCULAR | Qty: 1 | Status: AC

## 2021-07-04 ENCOUNTER — Inpatient Hospital Stay (HOSPITAL_COMMUNITY): Payer: BC Managed Care – PPO

## 2021-07-04 VITALS — BP 112/68 | HR 92 | Temp 98.1°F | Resp 18

## 2021-07-04 DIAGNOSIS — Z5111 Encounter for antineoplastic chemotherapy: Secondary | ICD-10-CM | POA: Diagnosis not present

## 2021-07-04 DIAGNOSIS — C50912 Malignant neoplasm of unspecified site of left female breast: Secondary | ICD-10-CM

## 2021-07-04 LAB — CBC WITH DIFFERENTIAL/PLATELET
Abs Immature Granulocytes: 0.08 10*3/uL — ABNORMAL HIGH (ref 0.00–0.07)
Basophils Absolute: 0.1 10*3/uL (ref 0.0–0.1)
Basophils Relative: 2 %
Eosinophils Absolute: 0 10*3/uL (ref 0.0–0.5)
Eosinophils Relative: 0 %
HCT: 27 % — ABNORMAL LOW (ref 36.0–46.0)
Hemoglobin: 9.1 g/dL — ABNORMAL LOW (ref 12.0–15.0)
Immature Granulocytes: 2 %
Lymphocytes Relative: 10 %
Lymphs Abs: 0.4 10*3/uL — ABNORMAL LOW (ref 0.7–4.0)
MCH: 31.3 pg (ref 26.0–34.0)
MCHC: 33.7 g/dL (ref 30.0–36.0)
MCV: 92.8 fL (ref 80.0–100.0)
Monocytes Absolute: 0.5 10*3/uL (ref 0.1–1.0)
Monocytes Relative: 11 %
Neutro Abs: 3.4 10*3/uL (ref 1.7–7.7)
Neutrophils Relative %: 75 %
Platelets: 336 10*3/uL (ref 150–400)
RBC: 2.91 MIL/uL — ABNORMAL LOW (ref 3.87–5.11)
RDW: 17.5 % — ABNORMAL HIGH (ref 11.5–15.5)
WBC: 4.5 10*3/uL (ref 4.0–10.5)
nRBC: 0 % (ref 0.0–0.2)

## 2021-07-04 LAB — COMPREHENSIVE METABOLIC PANEL
ALT: 47 U/L — ABNORMAL HIGH (ref 0–44)
AST: 31 U/L (ref 15–41)
Albumin: 4.1 g/dL (ref 3.5–5.0)
Alkaline Phosphatase: 69 U/L (ref 38–126)
Anion gap: 9 (ref 5–15)
BUN: 10 mg/dL (ref 6–20)
CO2: 23 mmol/L (ref 22–32)
Calcium: 9.1 mg/dL (ref 8.9–10.3)
Chloride: 107 mmol/L (ref 98–111)
Creatinine, Ser: 0.58 mg/dL (ref 0.44–1.00)
GFR, Estimated: >60 mL/min (ref >60)
Glucose, Bld: 121 mg/dL — ABNORMAL HIGH (ref 70–99)
Potassium: 3.6 mmol/L (ref 3.5–5.1)
Sodium: 139 mmol/L (ref 135–145)
Total Bilirubin: 0.4 mg/dL (ref 0.3–1.2)
Total Protein: 7.1 g/dL (ref 6.5–8.1)

## 2021-07-04 LAB — MAGNESIUM: Magnesium: 2 mg/dL (ref 1.7–2.4)

## 2021-07-04 MED ORDER — SODIUM CHLORIDE 0.9 % IV SOLN: Freq: Once | INTRAVENOUS | Status: AC

## 2021-07-04 MED ORDER — SODIUM CHLORIDE 0.9 % IV SOLN
80.0000 mg/m2 | Freq: Once | INTRAVENOUS | Status: AC
  Administered 2021-07-04: 150 mg via INTRAVENOUS
  Filled 2021-07-04: qty 25

## 2021-07-04 MED ORDER — DIPHENHYDRAMINE HCL 50 MG/ML IJ SOLN
50.0000 mg | Freq: Once | INTRAMUSCULAR | Status: AC
  Administered 2021-07-04: 50 mg via INTRAVENOUS
  Filled 2021-07-04: qty 1

## 2021-07-04 MED ORDER — SODIUM CHLORIDE 0.9% FLUSH
10.0000 mL | INTRAVENOUS | Status: DC | PRN
  Administered 2021-07-04: 10 mL

## 2021-07-04 MED ORDER — FAMOTIDINE IN NACL 20-0.9 MG/50ML-% IV SOLN
20.0000 mg | Freq: Once | INTRAVENOUS | Status: AC
  Administered 2021-07-04: 20 mg via INTRAVENOUS
  Filled 2021-07-04: qty 50

## 2021-07-04 MED ORDER — HEPARIN SOD (PORK) LOCK FLUSH 100 UNIT/ML IV SOLN
500.0000 [IU] | Freq: Once | INTRAVENOUS | Status: AC | PRN
  Administered 2021-07-04: 500 [IU]

## 2021-07-04 MED ORDER — SODIUM CHLORIDE 0.9 % IV SOLN
10.0000 mg | Freq: Once | INTRAVENOUS | Status: AC
  Administered 2021-07-04: 10 mg via INTRAVENOUS
  Filled 2021-07-04: qty 10

## 2021-07-04 NOTE — Progress Notes (Signed)
Patient presents today for Taxol per providers order.  Vital signs within parameters for treatment.  Labs pending.  Patient has no new complaints at this time.   Taxol given today per MD orders.  Stable during infusion without adverse affects.  Vital signs stable.  No complaints at this time.  Discharge from clinic ambulatory in stable condition.  Alert and oriented X 3.  Follow up with [facility] as scheduled.

## 2021-07-04 NOTE — Patient Instructions (Signed)
[facility]  Discharge Instructions: Thank you for choosing [facility] to provide your oncology and hematology care.  If you have a lab appointment with the [facility], please come in thru the Main Entrance and check in at the main information desk.  Wear comfortable clothing and clothing appropriate for easy access to any Portacath or PICC line.   We strive to give you quality time with your provider. You may need to reschedule your appointment if you arrive late (15 or more minutes).  Arriving late affects you and other patients whose appointments are after yours.  Also, if you miss three or more appointments without notifying the office, you may be dismissed from the clinic at the provider's discretion.      For prescription refill requests, have your pharmacy contact our office and allow 72 hours for refills to be completed.    Today you received the following chemotherapy and/or immunotherapy agents Taxol      To help prevent nausea and vomiting after your treatment, we encourage you to take your nausea medication as directed.  BELOW ARE SYMPTOMS THAT SHOULD BE REPORTED IMMEDIATELY: *FEVER GREATER THAN 100.4 F (38 C) OR HIGHER *CHILLS OR SWEATING *NAUSEA AND VOMITING THAT IS NOT CONTROLLED WITH YOUR NAUSEA MEDICATION *UNUSUAL SHORTNESS OF BREATH *UNUSUAL BRUISING OR BLEEDING *URINARY PROBLEMS (pain or burning when urinating, or frequent urination) *BOWEL PROBLEMS (unusual diarrhea, constipation, pain near the anus) TENDERNESS IN MOUTH AND THROAT WITH OR WITHOUT PRESENCE OF ULCERS (sore throat, sores in mouth, or a toothache) UNUSUAL RASH, SWELLING OR PAIN  UNUSUAL VAGINAL DISCHARGE OR ITCHING   Items with * indicate a potential emergency and should be followed up as soon as possible or go to the Emergency Department if any problems should occur.  Please show the CHEMOTHERAPY ALERT CARD or IMMUNOTHERAPY ALERT CARD at check-in to the Emergency Department  and triage nurse.  Should you have questions after your visit or need to cancel or reschedule your appointment, please contact [facility] [phone]  and follow the prompts.  Office hours are 8:00 a.m. to 4:30 p.m. Monday - Friday. Please note that voicemails left after 4:00 p.m. may not be returned until the following business day.  We are closed weekends and major holidays. You have access to a nurse at all times for urgent questions. Please call the main number to the clinic [phone] and follow the prompts.  For any non-urgent questions, you may also contact your provider using MyChart. We now offer e-Visits for anyone 66 and older to request care online for non-urgent symptoms. For details visit [email or web address].   Also download the MyChart app! Go to the app store, search "MyChart", open the app, select [facility], and log in with your MyChart username and password.  Masks are optional in the cancer centers. If you would like for your care team to wear a mask while they are taking care of you, please let them know. For doctor visits, patients may have with them one support person who is at least 48 years old. At this time, visitors are not allowed in the infusion area.

## 2021-07-12 ENCOUNTER — Inpatient Hospital Stay (HOSPITAL_COMMUNITY): Payer: BC Managed Care – PPO

## 2021-07-12 ENCOUNTER — Inpatient Hospital Stay (HOSPITAL_COMMUNITY): Payer: BC Managed Care – PPO | Admitting: Hematology

## 2021-07-12 ENCOUNTER — Inpatient Hospital Stay (HOSPITAL_COMMUNITY): Payer: BC Managed Care – PPO | Attending: Hematology

## 2021-07-12 ENCOUNTER — Ambulatory Visit (HOSPITAL_COMMUNITY): Payer: BC Managed Care – PPO

## 2021-07-12 ENCOUNTER — Other Ambulatory Visit (HOSPITAL_COMMUNITY): Payer: BC Managed Care – PPO

## 2021-07-12 VITALS — BP 114/77 | HR 94 | Temp 97.6°F | Resp 18

## 2021-07-12 VITALS — BP 125/71 | HR 92 | Temp 97.3°F | Resp 18 | Ht 68.31 in | Wt 161.2 lb

## 2021-07-12 DIAGNOSIS — F419 Anxiety disorder, unspecified: Secondary | ICD-10-CM | POA: Diagnosis not present

## 2021-07-12 DIAGNOSIS — M25551 Pain in right hip: Secondary | ICD-10-CM | POA: Insufficient documentation

## 2021-07-12 DIAGNOSIS — R5383 Other fatigue: Secondary | ICD-10-CM | POA: Diagnosis not present

## 2021-07-12 DIAGNOSIS — M25552 Pain in left hip: Secondary | ICD-10-CM | POA: Diagnosis not present

## 2021-07-12 DIAGNOSIS — C50912 Malignant neoplasm of unspecified site of left female breast: Secondary | ICD-10-CM | POA: Diagnosis not present

## 2021-07-12 DIAGNOSIS — F32A Depression, unspecified: Secondary | ICD-10-CM | POA: Diagnosis not present

## 2021-07-12 DIAGNOSIS — Z170 Estrogen receptor positive status [ER+]: Secondary | ICD-10-CM | POA: Insufficient documentation

## 2021-07-12 DIAGNOSIS — Z5111 Encounter for antineoplastic chemotherapy: Secondary | ICD-10-CM | POA: Insufficient documentation

## 2021-07-12 LAB — CBC WITH DIFFERENTIAL/PLATELET
Abs Immature Granulocytes: 0.07 10*3/uL (ref 0.00–0.07)
Basophils Absolute: 0.1 10*3/uL (ref 0.0–0.1)
Basophils Relative: 1 %
Eosinophils Absolute: 0.1 10*3/uL (ref 0.0–0.5)
Eosinophils Relative: 1 %
HCT: 29.2 % — ABNORMAL LOW (ref 36.0–46.0)
Hemoglobin: 9.7 g/dL — ABNORMAL LOW (ref 12.0–15.0)
Immature Granulocytes: 1 %
Lymphocytes Relative: 13 %
Lymphs Abs: 0.6 10*3/uL — ABNORMAL LOW (ref 0.7–4.0)
MCH: 31.7 pg (ref 26.0–34.0)
MCHC: 33.2 g/dL (ref 30.0–36.0)
MCV: 95.4 fL (ref 80.0–100.0)
Monocytes Absolute: 0.4 10*3/uL (ref 0.1–1.0)
Monocytes Relative: 9 %
Neutro Abs: 3.6 10*3/uL (ref 1.7–7.7)
Neutrophils Relative %: 75 %
Platelets: 260 10*3/uL (ref 150–400)
RBC: 3.06 MIL/uL — ABNORMAL LOW (ref 3.87–5.11)
RDW: 17.5 % — ABNORMAL HIGH (ref 11.5–15.5)
WBC: 4.9 10*3/uL (ref 4.0–10.5)
nRBC: 0 % (ref 0.0–0.2)

## 2021-07-12 LAB — COMPREHENSIVE METABOLIC PANEL
ALT: 59 U/L — ABNORMAL HIGH (ref 0–44)
AST: 43 U/L — ABNORMAL HIGH (ref 15–41)
Albumin: 3.8 g/dL (ref 3.5–5.0)
Alkaline Phosphatase: 63 U/L (ref 38–126)
Anion gap: 5 (ref 5–15)
BUN: 8 mg/dL (ref 6–20)
CO2: 24 mmol/L (ref 22–32)
Calcium: 9 mg/dL (ref 8.9–10.3)
Chloride: 109 mmol/L (ref 98–111)
Creatinine, Ser: 0.6 mg/dL (ref 0.44–1.00)
GFR, Estimated: >60 mL/min (ref >60)
Glucose, Bld: 110 mg/dL — ABNORMAL HIGH (ref 70–99)
Potassium: 3.8 mmol/L (ref 3.5–5.1)
Sodium: 138 mmol/L (ref 135–145)
Total Bilirubin: 0.6 mg/dL (ref 0.3–1.2)
Total Protein: 6.8 g/dL (ref 6.5–8.1)

## 2021-07-12 LAB — MAGNESIUM: Magnesium: 2 mg/dL (ref 1.7–2.4)

## 2021-07-12 MED ORDER — FAMOTIDINE IN NACL 20-0.9 MG/50ML-% IV SOLN
20.0000 mg | Freq: Once | INTRAVENOUS | Status: AC
  Administered 2021-07-12: 20 mg via INTRAVENOUS
  Filled 2021-07-12: qty 50

## 2021-07-12 MED ORDER — SODIUM CHLORIDE 0.9 % IV SOLN
80.0000 mg/m2 | Freq: Once | INTRAVENOUS | Status: AC
  Administered 2021-07-12: 150 mg via INTRAVENOUS
  Filled 2021-07-12: qty 25

## 2021-07-12 MED ORDER — DIPHENHYDRAMINE HCL 50 MG/ML IJ SOLN
50.0000 mg | Freq: Once | INTRAMUSCULAR | Status: AC
  Administered 2021-07-12: 50 mg via INTRAVENOUS
  Filled 2021-07-12: qty 1

## 2021-07-12 MED ORDER — SODIUM CHLORIDE 0.9 % IV SOLN: Freq: Once | INTRAVENOUS | Status: AC

## 2021-07-12 MED ORDER — SODIUM CHLORIDE 0.9% FLUSH
10.0000 mL | INTRAVENOUS | Status: DC | PRN
  Administered 2021-07-12: 10 mL

## 2021-07-12 MED ORDER — SODIUM CHLORIDE 0.9 % IV SOLN
10.0000 mg | Freq: Once | INTRAVENOUS | Status: AC
  Administered 2021-07-12: 10 mg via INTRAVENOUS
  Filled 2021-07-12: qty 10

## 2021-07-12 MED ORDER — HEPARIN SOD (PORK) LOCK FLUSH 100 UNIT/ML IV SOLN
500.0000 [IU] | Freq: Once | INTRAVENOUS | Status: AC | PRN
  Administered 2021-07-12: 500 [IU]

## 2021-07-12 NOTE — Patient Instructions (Signed)
Debra Vaughn  Discharge Instructions: Thank you for choosing [facility] to provide your oncology and hematology care.  If you have a lab appointment with the [facility], please come in thru the Main Entrance and check in at the main information desk.  Wear comfortable clothing and clothing appropriate for easy access to any Portacath or PICC line.   We strive to give you quality time with your provider. You may need to reschedule your appointment if you arrive late (15 or more minutes).  Arriving late affects you and other patients whose appointments are after yours.  Also, if you miss three or more appointments without notifying the office, you may be dismissed from the clinic at the provider's discretion.      For prescription refill requests, have your pharmacy contact our office and allow 72 hours for refills to be completed.    Today you received the following chemotherapy and/or immunotherapy agents Taxol      To help prevent nausea and vomiting after your treatment, we encourage you to take your nausea medication as directed.  BELOW ARE SYMPTOMS THAT SHOULD BE REPORTED IMMEDIATELY: *FEVER GREATER THAN 100.4 F (38 C) OR HIGHER *CHILLS OR SWEATING *NAUSEA AND VOMITING THAT IS NOT CONTROLLED WITH YOUR NAUSEA MEDICATION *UNUSUAL SHORTNESS OF BREATH *UNUSUAL BRUISING OR BLEEDING *URINARY PROBLEMS (pain or burning when urinating, or frequent urination) *BOWEL PROBLEMS (unusual diarrhea, constipation, pain near the anus) TENDERNESS IN MOUTH AND THROAT WITH OR WITHOUT PRESENCE OF ULCERS (sore throat, sores in mouth, or a toothache) UNUSUAL RASH, SWELLING OR PAIN  UNUSUAL VAGINAL DISCHARGE OR ITCHING   Items with * indicate a potential emergency and should be followed up as soon as possible or go to the Emergency Department if any problems should occur.  Please show the CHEMOTHERAPY ALERT CARD or IMMUNOTHERAPY ALERT CARD at check-in to the Emergency Department  and triage nurse.  Should you have questions after your visit or need to cancel or reschedule your appointment, please contact [facility] [phone]  and follow the prompts.  Office hours are 8:00 a.m. to 4:30 p.m. Monday - Friday. Please note that voicemails left after 4:00 p.m. may not be returned until the following business day.  We are closed weekends and major holidays. You have access to a nurse at all times for urgent questions. Please call the main number to the clinic [phone] and follow the prompts.  For any non-urgent questions, you may also contact your provider using MyChart. We now offer e-Visits for anyone 64 and older to request care online for non-urgent symptoms. For details visit [email or web address].   Also download the MyChart app! Go to the app store, search "MyChart", open the app, select [facility], and log in with your MyChart username and password.  Masks are optional in the cancer centers. If you would like for your care team to wear a mask while they are taking care of you, please let them know. For doctor visits, patients may have with them one support person who is at least 48 years old. At this time, visitors are not allowed in the infusion area.  Paclitaxel injection What is this medication? PACLITAXEL (PAK li TAX el) is a chemotherapy drug. It targets fast dividing cells, like cancer cells, and causes these cells to die. This medicine is used to treat ovarian cancer, breast cancer, lung cancer, Kaposi's sarcoma, and other cancers. This medicine may be used for other purposes; ask your health care provider  or pharmacist if you have questions. COMMON BRAND NAME(S): Onxol, Taxol What should I tell my care team before I take this medication? They need to know if you have any of these conditions: history of irregular heartbeat liver disease low blood counts, like low white cell, platelet, or red cell counts lung or breathing disease, like  asthma tingling of the fingers or toes, or other nerve disorder an unusual or allergic reaction to paclitaxel, alcohol, polyoxyethylated castor oil, other chemotherapy, other medicines, foods, dyes, or preservatives pregnant or trying to get pregnant breast-feeding How should I use this medication? This drug is given as an infusion into a vein. It is administered in a hospital or clinic by a specially trained health care professional. Talk to your pediatrician regarding the use of this medicine in children. Special care may be needed. Overdosage: If you think you have taken too much of this medicine contact a poison control center or emergency room at once. NOTE: This medicine is only for you. Do not share this medicine with others. What if I miss a dose? It is important not to miss your dose. Call your doctor or health care professional if you are unable to keep an appointment. What may interact with this medication? Do not take this medicine with any of the following medications: live virus vaccines This medicine may also interact with the following medications: antiviral medicines for hepatitis, HIV or AIDS certain antibiotics like erythromycin and clarithromycin certain medicines for fungal infections like ketoconazole and itraconazole certain medicines for seizures like carbamazepine, phenobarbital, phenytoin gemfibrozil nefazodone rifampin St. John's wort This list may not describe all possible interactions. Give your health care provider a list of all the medicines, herbs, non-prescription drugs, or dietary supplements you use. Also tell them if you smoke, drink alcohol, or use illegal drugs. Some items may interact with your medicine. What should I watch for while using this medication? Your condition will be monitored carefully while you are receiving this medicine. You will need important blood work done while you are taking this medicine. This medicine can cause serious allergic  reactions. To reduce your risk you will need to take other medicine(s) before treatment with this medicine. If you experience allergic reactions like skin rash, itching or hives, swelling of the face, lips, or tongue, tell your doctor or health care professional right away. In some cases, you may be given additional medicines to help with side effects. Follow all directions for their use. This drug may make you feel generally unwell. This is not uncommon, as chemotherapy can affect healthy cells as well as cancer cells. Report any side effects. Continue your course of treatment even though you feel ill unless your doctor tells you to stop. Call your doctor or health care professional for advice if you get a fever, chills or sore throat, or other symptoms of a cold or flu. Do not treat yourself. This drug decreases your body's ability to fight infections. Try to avoid being around people who are sick. This medicine may increase your risk to bruise or bleed. Call your doctor or health care professional if you notice any unusual bleeding. Be careful brushing and flossing your teeth or using a toothpick because you may get an infection or bleed more easily. If you have any dental work done, tell your dentist you are receiving this medicine. Avoid taking products that contain aspirin, acetaminophen, ibuprofen, naproxen, or ketoprofen unless instructed by your doctor. These medicines may hide a fever. Do not become  pregnant while taking this medicine. Women should inform their doctor if they wish to become pregnant or think they might be pregnant. There is a potential for serious side effects to an unborn child. Talk to your health care professional or pharmacist for more information. Do not breast-feed an infant while taking this medicine. Men are advised not to father a child while receiving this medicine. This product may contain alcohol. Ask your pharmacist or healthcare provider if this medicine contains  alcohol. Be sure to tell all healthcare providers you are taking this medicine. Certain medicines, like metronidazole and disulfiram, can cause an unpleasant reaction when taken with alcohol. The reaction includes flushing, headache, nausea, vomiting, sweating, and increased thirst. The reaction can last from 30 minutes to several hours. What side effects may I notice from receiving this medication? Side effects that you should report to your doctor or health care professional as soon as possible: allergic reactions like skin rash, itching or hives, swelling of the face, lips, or tongue breathing problems changes in vision fast, irregular heartbeat high or low blood pressure mouth sores pain, tingling, numbness in the hands or feet signs of decreased platelets or bleeding - bruising, pinpoint red spots on the skin, black, tarry stools, blood in the urine signs of decreased red blood cells - unusually weak or tired, feeling faint or lightheaded, falls signs of infection - fever or chills, cough, sore throat, pain or difficulty passing urine signs and symptoms of liver injury like dark yellow or brown urine; general ill feeling or flu-like symptoms; light-colored stools; loss of appetite; nausea; right upper belly pain; unusually weak or tired; yellowing of the eyes or skin swelling of the ankles, feet, hands unusually slow heartbeat Side effects that usually do not require medical attention (report to your doctor or health care professional if they continue or are bothersome): diarrhea hair loss loss of appetite muscle or joint pain nausea, vomiting pain, redness, or irritation at site where injected tiredness This list may not describe all possible side effects. Call your doctor for medical advice about side effects. You may report side effects to FDA at 1-800-FDA-1088. Where should I keep my medication? This drug is given in a hospital or clinic and will not be stored at home. NOTE: This  sheet is a summary. It may not cover all possible information. If you have questions about this medicine, talk to your doctor, pharmacist, or health care provider.  2023 Elsevier/Gold Standard (2020-11-24 00:00:00)

## 2021-07-12 NOTE — Patient Instructions (Signed)
[facility] at [facility] [place] Discharge Instructions  You were seen and examined today by Dr. Delton Coombes.  Dr. Delton Coombes discussed your most recent lab work today which is stable. Your liver functions are slightly elevated but this could be related to the Taxol.  Proceed with treatment as planned today!  Follow-up as scheduled.  Thank you for choosing [facility] at [facility] to provide your oncology and hematology care.  To afford each patient quality time with our provider, please arrive at least 15 minutes before your scheduled appointment time.   If you have a lab appointment with the [facility] please come in thru the Main Entrance and check in at the main information desk.  You need to re-schedule your appointment should you arrive 10 or more minutes late.  We strive to give you quality time with our providers, and arriving late affects you and other patients whose appointments are after yours.  Also, if you no show three or more times for appointments you may be dismissed from the clinic at the providers discretion.     Again, thank you for choosing [facility].  Our hope is that these requests will decrease the amount of time that you wait before being seen by our physicians.       _____________________________________________________________  Should you have questions after your visit to [facility], please contact our office at [phone] and follow the prompts.  Our office hours are 8:00 a.m. and 4:30 p.m. Monday - Friday.  Please note that voicemails left after 4:00 p.m. may not be returned until the following business day.  We are closed weekends and major holidays.  You do have access to a nurse 24-7, just call the main number to the clinic [phone] and do not press any options, hold on the line and a nurse will answer the phone.    For prescription refill requests, have your pharmacy contact our  office and allow 72 hours.

## 2021-07-12 NOTE — Progress Notes (Signed)
[name], [place] [address]   CLINIC:  Medical Oncology/Hematology  PCP:  Jacqualine Code, [address] / [place] [address] [phone]   REASON FOR VISIT:  Follow-up for stage II T2N1 invasive lobular carcinoma of left breast  PRIOR THERAPY: Left breast lumpectomy and SLNB on 02/01/2021 by Dr. Matt Holmes  NGS Results: not done  CURRENT THERAPY: ADJUVANT DOSE DENSE AC q14d / PACLitaxel q7d  BRIEF ONCOLOGIC HISTORY:  Oncology History  Invasive lobular carcinoma of left breast in female [facility])  04/11/2021 Initial Diagnosis   Invasive lobular carcinoma of left breast in female [facility])   05/01/2021 -  Chemotherapy   Patient is on Treatment Plan : BREAST ADJUVANT DOSE DENSE AC q14d / PACLitaxel q7d       CANCER STAGING:  Cancer Staging  Invasive lobular carcinoma of left breast in female [facility]) Staging form: Breast, AJCC 8th Edition - Clinical stage from 04/11/2021: Stage IIA (cT2, cN1, cM0, G2, ER+, PR+, HER2-) - Unsigned   INTERVAL HISTORY:  Debra Vaughn, a 46 y.o. female, returns for routine follow-up and consideration for next cycle of chemotherapy. [name] was last seen on 06/27/2021.  Due for cycle #3 of Taxol today.   Overall, she tells me she has been feeling pretty well. She reports fatigue and flushing on the day following her infusion. She denies numbness/tingling.   Overall, she feels ready for next cycle of chemo today.    REVIEW OF SYSTEMS:  Review of Systems  Constitutional:  Negative for appetite change and fatigue.  Neurological:  Negative for numbness.  Psychiatric/Behavioral:  Positive for depression. The patient is nervous/anxious.   All other systems reviewed and are negative.   PAST MEDICAL/SURGICAL HISTORY:  No past medical history on file. Past Surgical History:  Procedure Laterality Date   BREAST BIOPSY Right 04/06/2021    SOCIAL HISTORY:  Social History   Socioeconomic History   Marital  status: Married    Spouse name: Not on file   Number of children: Not on file   Years of education: Not on file   Highest education level: Not on file  Occupational History   Not on file  Tobacco Use   Smoking status: Never   Smokeless tobacco: Never  Vaping Use   Vaping Use: Never used  Substance and Sexual Activity   Alcohol use: Not Currently    Comment: occasionally   Drug use: Never   Sexual activity: Not Currently  Other Topics Concern   Not on file  Social History Narrative   Not on file   Social Determinants of Health   Financial Resource Strain: Not on file  Food Insecurity: Not on file  Transportation Needs: Not on file  Physical Activity: Not on file  Stress: Not on file  Social Connections: Not on file  Intimate Partner Violence: Not on file    FAMILY HISTORY:  No family history on file.  CURRENT MEDICATIONS:  Current Outpatient Medications  Medication Sig Dispense Refill   amoxicillin (AMOXIL) 500 MG capsule Take 500 mg by mouth 3 (three) times daily.     busPIRone (BUSPAR) 7.5 MG tablet Take 7.5 mg by mouth 3 (three) times daily.     CYCLOPHOSPHAMIDE IV Inject into the vein every 14 (fourteen) days.     DOXORUBICIN HCL IV Inject into the vein every 14 (fourteen) days.     FLUoxetine (PROZAC) 20 MG capsule Take 60 mg by mouth every morning.  Investigational dexamethasone 4 MG tablet [facility]RCC 16070 Blister Card 2 Take 2 tablets by mouth daily. Take in the morning on Days 2-4. 6 tablet 0   Investigational olanzapine/placebo 10 MG capsule [facility]RCC 16070 Blister Card 2 Take 1 capsule by mouth daily. Take in the morning on Days 2-4. 3 capsule 0   Investigational prochlorperazine maleate/placebo 10 MG capsule URCC 16070 Blister Card 2 Take 1 capsule by mouth every 8 (eight) hours. Take on Days 1-4. 11 capsule 0   levonorgestrel (MIRENA, 52 MG,) 20 MCG/DAY IUD by Intrauterine route.     lidocaine-prilocaine (EMLA) cream Apply a small amount to port a cath site and  cover with plastic wrap 1 hour prior to infusion appointments 30 g 3   lidocaine-prilocaine (EMLA) cream Apply to affected area once 30 g 3   prochlorperazine (COMPAZINE) 10 MG tablet Take 1 tablet (10 mg total) by mouth every 6 (six) hours as needed (Nausea or vomiting). 60 tablet 3   sucralfate (CARAFATE) 1 GM/10ML suspension Take 10 mLs (1 g total) by mouth 4 (four) times daily. Swish and swallow. 360 mL 2   No current facility-administered medications for this visit.   Facility-Administered Medications Ordered in Other Visits  Medication Dose Route Frequency Provider Last Rate Last Admin   octreotide (SANDOSTATIN LAR) 30 MG IM injection            sodium chloride flush (NS) 0.9 % injection 10 mL  10 mL Intravenous PRN Derek Jack, MD   10 mL at 05/08/21 1235    ALLERGIES:  Allergies  Allergen Reactions   Bupropion Hives    Other reaction(s): seizure    PHYSICAL EXAM:  Performance status (ECOG): 0 - Asymptomatic  There were no vitals filed for this visit. Wt Readings from Last 3 Encounters:  06/27/21 162 lb (73.5 kg)  06/12/21 162 lb 6.4 oz (73.7 kg)  05/29/21 165 lb 12.8 oz (75.2 kg)   Physical Exam Vitals reviewed.  Constitutional:      Appearance: Normal appearance.  Cardiovascular:     Rate and Rhythm: Normal rate and regular rhythm.     Pulses: Normal pulses.     Heart sounds: Normal heart sounds.  Pulmonary:     Effort: Pulmonary effort is normal.     Breath sounds: Normal breath sounds.  Musculoskeletal:     Right lower leg: No edema.     Left lower leg: No edema.  Neurological:     General: No focal deficit present.     Mental Status: She is alert and oriented to person, place, and time.  Psychiatric:        Mood and Affect: Mood normal.        Behavior: Behavior normal.     LABORATORY DATA:  I have reviewed the labs as listed.     Latest Ref Rng & Units 07/04/2021   11:47 AM 06/27/2021    8:56 AM 06/12/2021    9:08 AM  CBC  WBC 4.0 - 10.5  K/uL 4.5  16.4  20.4   Hemoglobin 12.0 - 15.0 g/dL 9.1  9.2  10.4   Hematocrit 36.0 - 46.0 % 27.0  27.5  30.8   Platelets 150 - 400 K/uL 336  233  173       Latest Ref Rng & Units 07/04/2021   11:47 AM 06/27/2021    8:56 AM 06/12/2021    9:08 AM  CMP  Glucose 70 - 99 mg/dL 121  116  90  BUN 6 - 20 mg/dL 10  11  10    Creatinine 0.44 - 1.00 mg/dL 0.58  0.63  0.61   Sodium 135 - 145 mmol/L 139  139  141   Potassium 3.5 - 5.1 mmol/L 3.6  3.8  4.0   Chloride 98 - 111 mmol/L 107  108  107   CO2 22 - 32 mmol/L 23  25  24    Calcium 8.9 - 10.3 mg/dL 9.1  8.9  9.2   Total Protein 6.5 - 8.1 g/dL 7.1  6.9  7.0   Total Bilirubin 0.3 - 1.2 mg/dL 0.4  0.5  0.3   Alkaline Phos 38 - 126 U/L 69  104  113   AST 15 - 41 U/L 31  37  17   ALT 0 - 44 U/L 47  35  17     DIAGNOSTIC IMAGING:  I have independently reviewed the scans and discussed with the patient. No results found.   ASSESSMENT:  Stage II T2N1 left breast invasive lobular carcinoma, ER/PR positive and HER2 negative: - She felt the lump in her left breast before Thanksgiving in 2022.  She missed mammograms for few years prior to that. - Mammogram was done in [facility]. - Left breast biopsy on 01/10/2021: Invasive lobular carcinoma, intermediate grade, ER/PR 90% positive, Ki-67 5 to 10% positive, HER2 negative - Left breast lumpectomy and SLNB on 02/01/2021 by Dr. Matt Holmes - Pathology: Multifocal infiltrating lobular carcinoma, largest focus measuring 3.5 cm, multiple additional nodules present in the specimen particularly in the medial and lateral margins, grade 2, invasive tumor at the inked medial margin, 2/2 positive axillary lymph nodes, Ki-67 variable (40-50% with a number of tumor nodules stain and examined), ER/PR positive, HER2 negative.  Additional anterior medial margin was negative.  No tumor seen at the left breast deep margin tissue. - Patient reportedly had genetic testing done and was told BRCA -7 years ago in [place]. - MRI  of the breast on 03/30/2021: Mildly prominent left subpectoral lymph node measuring 8 mm.  Solitary prominent left axillary lymph node.  5 mm mass in the upper inner quadrant of the right breast. - Right breast biopsy: Pseudoangiomatous stromal hyperplasia, blunt duct adenosis, no malignancy identified.  Left breast intramammary lymph node shows nodal tissue with pigmented histiocytes.  No malignancy. - Oncotype DX: RS-[id number].  Distal recurrence risk at 9 years on tamoxifen was 21%. - Dose dense AC started on 05/01/2021 - PET scan on 04/20/2021: Postsurgical changes in both breasts without metabolic activity for residual tumor.  Small focus of faint hypermetabolic activity within the left anterior chest wall involving the pectoralis muscle or subpectoral space without corresponding CT finding.  No evidence of distant metastatic disease.    Social/family history: - She lives in [place] with her husband.  She works as a Licensed conveyancer.  She is a non-smoker. - Mother had breast cancer at age 12.  Paternal grandmother had thyroid cancer.   PLAN:  Stage II T2N1 invasive lobular carcinoma of left breast: - She was started on weekly paclitaxel 2 weeks ago. - She has been tolerating it very well. - Denies any tingling or numbness in extremities.  Reviewed labs today which showed mildly elevated AST and ALT of 43 and 59 respectively.  Hemoglobin has improved to 9.7.  Rest of CBC was normal. - Recommend proceeding with week 3 today and then weekly without any dose modifications.  RTC 3 weeks for follow-up with repeat labs and treatment. - Upon  completion of weekly paclitaxel, will refer her to [facility] radiation oncology which is close to her.  2.  Anxiety/depression: - Continue Prozac 60 mg daily and use BuSpar 3 times daily as needed.   Orders placed this encounter:  No orders of the defined types were placed in this encounter.    Derek Jack, MD [place] [phone]   I, Thana Ates, am acting as a scribe for Dr. Derek Jack.  I, Derek Jack MD, have reviewed the above documentation for accuracy and completeness, and I agree with the above.

## 2021-07-12 NOTE — Progress Notes (Signed)
Labs reviewed with MD today. Proceed with treatment as planned.   Treatment given per orders. Patient tolerated it well without problems. Vitals stable and discharged home from clinic ambulatory. Follow up as scheduled.  

## 2021-07-19 ENCOUNTER — Encounter (HOSPITAL_COMMUNITY): Payer: Self-pay

## 2021-07-19 ENCOUNTER — Inpatient Hospital Stay (HOSPITAL_COMMUNITY): Payer: BC Managed Care – PPO

## 2021-07-19 VITALS — BP 115/77 | HR 86 | Temp 98.4°F | Resp 18

## 2021-07-19 DIAGNOSIS — C50912 Malignant neoplasm of unspecified site of left female breast: Secondary | ICD-10-CM

## 2021-07-19 DIAGNOSIS — Z5111 Encounter for antineoplastic chemotherapy: Secondary | ICD-10-CM | POA: Diagnosis not present

## 2021-07-19 LAB — COMPREHENSIVE METABOLIC PANEL
ALT: 67 U/L — ABNORMAL HIGH (ref 0–44)
AST: 39 U/L (ref 15–41)
Albumin: 3.9 g/dL (ref 3.5–5.0)
Alkaline Phosphatase: 64 U/L (ref 38–126)
Anion gap: 4 — ABNORMAL LOW (ref 5–15)
BUN: 9 mg/dL (ref 6–20)
CO2: 26 mmol/L (ref 22–32)
Calcium: 8.9 mg/dL (ref 8.9–10.3)
Chloride: 109 mmol/L (ref 98–111)
Creatinine, Ser: 0.64 mg/dL (ref 0.44–1.00)
GFR, Estimated: >60 mL/min (ref >60)
Glucose, Bld: 112 mg/dL — ABNORMAL HIGH (ref 70–99)
Potassium: 3.6 mmol/L (ref 3.5–5.1)
Sodium: 139 mmol/L (ref 135–145)
Total Bilirubin: 0.7 mg/dL (ref 0.3–1.2)
Total Protein: 6.6 g/dL (ref 6.5–8.1)

## 2021-07-19 LAB — CBC WITH DIFFERENTIAL/PLATELET
Abs Immature Granulocytes: 0.05 10*3/uL (ref 0.00–0.07)
Basophils Absolute: 0.1 10*3/uL (ref 0.0–0.1)
Basophils Relative: 1 %
Eosinophils Absolute: 0.2 10*3/uL (ref 0.0–0.5)
Eosinophils Relative: 3 %
HCT: 29.1 % — ABNORMAL LOW (ref 36.0–46.0)
Hemoglobin: 9.8 g/dL — ABNORMAL LOW (ref 12.0–15.0)
Immature Granulocytes: 1 %
Lymphocytes Relative: 12 %
Lymphs Abs: 0.6 10*3/uL — ABNORMAL LOW (ref 0.7–4.0)
MCH: 32.1 pg (ref 26.0–34.0)
MCHC: 33.7 g/dL (ref 30.0–36.0)
MCV: 95.4 fL (ref 80.0–100.0)
Monocytes Absolute: 0.3 10*3/uL (ref 0.1–1.0)
Monocytes Relative: 6 %
Neutro Abs: 4.1 10*3/uL (ref 1.7–7.7)
Neutrophils Relative %: 77 %
Platelets: 280 10*3/uL (ref 150–400)
RBC: 3.05 MIL/uL — ABNORMAL LOW (ref 3.87–5.11)
RDW: 16 % — ABNORMAL HIGH (ref 11.5–15.5)
WBC: 5.3 10*3/uL (ref 4.0–10.5)
nRBC: 0 % (ref 0.0–0.2)

## 2021-07-19 LAB — MAGNESIUM: Magnesium: 2 mg/dL (ref 1.7–2.4)

## 2021-07-19 MED ORDER — SODIUM CHLORIDE 0.9% FLUSH
10.0000 mL | INTRAVENOUS | Status: DC | PRN
  Administered 2021-07-19 (×2): 10 mL

## 2021-07-19 MED ORDER — SODIUM CHLORIDE 0.9 % IV SOLN
80.0000 mg/m2 | Freq: Once | INTRAVENOUS | Status: AC
  Administered 2021-07-19: 150 mg via INTRAVENOUS
  Filled 2021-07-19: qty 25

## 2021-07-19 MED ORDER — HEPARIN SOD (PORK) LOCK FLUSH 100 UNIT/ML IV SOLN
500.0000 [IU] | Freq: Once | INTRAVENOUS | Status: AC | PRN
  Administered 2021-07-19: 500 [IU]

## 2021-07-19 MED ORDER — SODIUM CHLORIDE 0.9 % IV SOLN
10.0000 mg | Freq: Once | INTRAVENOUS | Status: AC
  Administered 2021-07-19: 10 mg via INTRAVENOUS
  Filled 2021-07-19: qty 10

## 2021-07-19 MED ORDER — FAMOTIDINE IN NACL 20-0.9 MG/50ML-% IV SOLN
20.0000 mg | Freq: Once | INTRAVENOUS | Status: AC
  Administered 2021-07-19: 20 mg via INTRAVENOUS
  Filled 2021-07-19: qty 50

## 2021-07-19 MED ORDER — DIPHENHYDRAMINE HCL 50 MG/ML IJ SOLN
50.0000 mg | Freq: Once | INTRAMUSCULAR | Status: AC
  Administered 2021-07-19: 50 mg via INTRAVENOUS
  Filled 2021-07-19: qty 1

## 2021-07-19 MED ORDER — SODIUM CHLORIDE 0.9 % IV SOLN: Freq: Once | INTRAVENOUS | Status: AC

## 2021-07-19 NOTE — Progress Notes (Signed)
Patient tolerated chemotherapy with no complaints voiced. Side effects with management reviewed understanding verbalized. Port site clean and dry with no bruising or swelling noted at site. Good blood return noted before and after administration of chemotherapy. Band aid applied. Patient left in satisfactory condition with VSS and no s/s of distress noted. 

## 2021-07-19 NOTE — Patient Instructions (Signed)
[facility]  Discharge Instructions: Thank you for choosing [facility] to provide your oncology and hematology care.  If you have a lab appointment with the [facility], please come in thru the Main Entrance and check in at the main information desk.  Wear comfortable clothing and clothing appropriate for easy access to any Portacath or PICC line.   We strive to give you quality time with your provider. You may need to reschedule your appointment if you arrive late (15 or more minutes).  Arriving late affects you and other patients whose appointments are after yours.  Also, if you miss three or more appointments without notifying the office, you may be dismissed from the clinic at the provider's discretion.      For prescription refill requests, have your pharmacy contact our office and allow 72 hours for refills to be completed.    Today you received the following chemotherapy and/or immunotherapy agents Taxol, return as scheduled.   To help prevent nausea and vomiting after your treatment, we encourage you to take your nausea medication as directed.  BELOW ARE SYMPTOMS THAT SHOULD BE REPORTED IMMEDIATELY: *FEVER GREATER THAN 100.4 F (38 C) OR HIGHER *CHILLS OR SWEATING *NAUSEA AND VOMITING THAT IS NOT CONTROLLED WITH YOUR NAUSEA MEDICATION *UNUSUAL SHORTNESS OF BREATH *UNUSUAL BRUISING OR BLEEDING *URINARY PROBLEMS (pain or burning when urinating, or frequent urination) *BOWEL PROBLEMS (unusual diarrhea, constipation, pain near the anus) TENDERNESS IN MOUTH AND THROAT WITH OR WITHOUT PRESENCE OF ULCERS (sore throat, sores in mouth, or a toothache) UNUSUAL RASH, SWELLING OR PAIN  UNUSUAL VAGINAL DISCHARGE OR ITCHING   Items with * indicate a potential emergency and should be followed up as soon as possible or go to the Emergency Department if any problems should occur.  Please show the CHEMOTHERAPY ALERT CARD or IMMUNOTHERAPY ALERT CARD at check-in to the  Emergency Department and triage nurse.  Should you have questions after your visit or need to cancel or reschedule your appointment, please contact [facility] [phone]  and follow the prompts.  Office hours are 8:00 a.m. to 4:30 p.m. Monday - Friday. Please note that voicemails left after 4:00 p.m. may not be returned until the following business day.  We are closed weekends and major holidays. You have access to a nurse at all times for urgent questions. Please call the main number to the clinic [phone] and follow the prompts.  For any non-urgent questions, you may also contact your provider using MyChart. We now offer e-Visits for anyone 47 and older to request care online for non-urgent symptoms. For details visit [email or web address].   Also download the MyChart app! Go to the app store, search "MyChart", open the app, select [facility], and log in with your MyChart username and password.  Masks are optional in the cancer centers. If you would like for your care team to wear a mask while they are taking care of you, please let them know. For doctor visits, patients may have with them one support person who is at least 48 years old. At this time, visitors are not allowed in the infusion area.

## 2021-07-26 ENCOUNTER — Inpatient Hospital Stay (HOSPITAL_COMMUNITY): Payer: BC Managed Care – PPO

## 2021-07-26 ENCOUNTER — Encounter (HOSPITAL_COMMUNITY): Payer: Self-pay

## 2021-07-26 VITALS — BP 116/73 | HR 96 | Temp 98.3°F | Resp 18 | Wt 161.6 lb

## 2021-07-26 DIAGNOSIS — Z5111 Encounter for antineoplastic chemotherapy: Secondary | ICD-10-CM | POA: Diagnosis not present

## 2021-07-26 DIAGNOSIS — C50912 Malignant neoplasm of unspecified site of left female breast: Secondary | ICD-10-CM

## 2021-07-26 DIAGNOSIS — Z09 Encounter for follow-up examination after completed treatment for conditions other than malignant neoplasm: Secondary | ICD-10-CM

## 2021-07-26 LAB — PREGNANCY, URINE: Preg Test, Ur: NEGATIVE

## 2021-07-26 LAB — CBC WITH DIFFERENTIAL/PLATELET
Abs Immature Granulocytes: 0.05 10*3/uL (ref 0.00–0.07)
Basophils Absolute: 0.1 10*3/uL (ref 0.0–0.1)
Basophils Relative: 1 %
Eosinophils Absolute: 0.2 10*3/uL (ref 0.0–0.5)
Eosinophils Relative: 4 %
HCT: 29.6 % — ABNORMAL LOW (ref 36.0–46.0)
Hemoglobin: 10 g/dL — ABNORMAL LOW (ref 12.0–15.0)
Immature Granulocytes: 1 %
Lymphocytes Relative: 11 %
Lymphs Abs: 0.6 10*3/uL — ABNORMAL LOW (ref 0.7–4.0)
MCH: 32.6 pg (ref 26.0–34.0)
MCHC: 33.8 g/dL (ref 30.0–36.0)
MCV: 96.4 fL (ref 80.0–100.0)
Monocytes Absolute: 0.3 10*3/uL (ref 0.1–1.0)
Monocytes Relative: 5 %
Neutro Abs: 4.4 10*3/uL (ref 1.7–7.7)
Neutrophils Relative %: 78 %
Platelets: 210 10*3/uL (ref 150–400)
RBC: 3.07 MIL/uL — ABNORMAL LOW (ref 3.87–5.11)
RDW: 14.9 % (ref 11.5–15.5)
WBC: 5.6 10*3/uL (ref 4.0–10.5)
nRBC: 0 % (ref 0.0–0.2)

## 2021-07-26 LAB — COMPREHENSIVE METABOLIC PANEL
ALT: 38 U/L (ref 0–44)
AST: 25 U/L (ref 15–41)
Albumin: 3.8 g/dL (ref 3.5–5.0)
Alkaline Phosphatase: 61 U/L (ref 38–126)
Anion gap: 5 (ref 5–15)
BUN: 10 mg/dL (ref 6–20)
CO2: 24 mmol/L (ref 22–32)
Calcium: 9 mg/dL (ref 8.9–10.3)
Chloride: 109 mmol/L (ref 98–111)
Creatinine, Ser: 0.56 mg/dL (ref 0.44–1.00)
GFR, Estimated: >60 mL/min (ref >60)
Glucose, Bld: 92 mg/dL (ref 70–99)
Potassium: 3.7 mmol/L (ref 3.5–5.1)
Sodium: 138 mmol/L (ref 135–145)
Total Bilirubin: 0.5 mg/dL (ref 0.3–1.2)
Total Protein: 6.7 g/dL (ref 6.5–8.1)

## 2021-07-26 LAB — MAGNESIUM: Magnesium: 1.9 mg/dL (ref 1.7–2.4)

## 2021-07-26 MED ORDER — HEPARIN SOD (PORK) LOCK FLUSH 100 UNIT/ML IV SOLN
500.0000 [IU] | Freq: Once | INTRAVENOUS | Status: AC | PRN
  Administered 2021-07-26: 500 [IU]

## 2021-07-26 MED ORDER — SODIUM CHLORIDE 0.9% FLUSH
10.0000 mL | INTRAVENOUS | Status: DC | PRN
  Administered 2021-07-26: 10 mL

## 2021-07-26 MED ORDER — DIPHENHYDRAMINE HCL 50 MG/ML IJ SOLN
50.0000 mg | Freq: Once | INTRAMUSCULAR | Status: AC
  Administered 2021-07-26: 50 mg via INTRAVENOUS
  Filled 2021-07-26: qty 1

## 2021-07-26 MED ORDER — SODIUM CHLORIDE 0.9 % IV SOLN
10.0000 mg | Freq: Once | INTRAVENOUS | Status: AC
  Administered 2021-07-26: 10 mg via INTRAVENOUS
  Filled 2021-07-26: qty 10

## 2021-07-26 MED ORDER — FAMOTIDINE IN NACL 20-0.9 MG/50ML-% IV SOLN
20.0000 mg | Freq: Once | INTRAVENOUS | Status: AC
  Administered 2021-07-26: 20 mg via INTRAVENOUS
  Filled 2021-07-26: qty 50

## 2021-07-26 MED ORDER — SODIUM CHLORIDE 0.9 % IV SOLN
80.0000 mg/m2 | Freq: Once | INTRAVENOUS | Status: AC
  Administered 2021-07-26: 150 mg via INTRAVENOUS
  Filled 2021-07-26: qty 25

## 2021-07-26 MED ORDER — SODIUM CHLORIDE 0.9 % IV SOLN: Freq: Once | INTRAVENOUS | Status: AC

## 2021-07-26 NOTE — Progress Notes (Signed)
Patient tolerated chemotherapy with no complaints voiced. Side effects with management reviewed understanding verbalized. Port site clean and dry with no bruising or swelling noted at site. Good blood return noted before and after administration of chemotherapy. Band aid applied. Patient left in satisfactory condition with VSS and no s/s of distress noted. 

## 2021-07-26 NOTE — Patient Instructions (Signed)
[facility]  Discharge Instructions: Thank you for choosing [facility] to provide your oncology and hematology care.  If you have a lab appointment with the [facility], please come in thru the Main Entrance and check in at the main information desk.  Wear comfortable clothing and clothing appropriate for easy access to any Portacath or PICC line.   We strive to give you quality time with your provider. You may need to reschedule your appointment if you arrive late (15 or more minutes).  Arriving late affects you and other patients whose appointments are after yours.  Also, if you miss three or more appointments without notifying the office, you may be dismissed from the clinic at the provider's discretion.      For prescription refill requests, have your pharmacy contact our office and allow 72 hours for refills to be completed.    Today you received the following chemotherapy and/or immunotherapy agents Taxol, return as scheduled.   To help prevent nausea and vomiting after your treatment, we encourage you to take your nausea medication as directed.  BELOW ARE SYMPTOMS THAT SHOULD BE REPORTED IMMEDIATELY: *FEVER GREATER THAN 100.4 F (38 C) OR HIGHER *CHILLS OR SWEATING *NAUSEA AND VOMITING THAT IS NOT CONTROLLED WITH YOUR NAUSEA MEDICATION *UNUSUAL SHORTNESS OF BREATH *UNUSUAL BRUISING OR BLEEDING *URINARY PROBLEMS (pain or burning when urinating, or frequent urination) *BOWEL PROBLEMS (unusual diarrhea, constipation, pain near the anus) TENDERNESS IN MOUTH AND THROAT WITH OR WITHOUT PRESENCE OF ULCERS (sore throat, sores in mouth, or a toothache) UNUSUAL RASH, SWELLING OR PAIN  UNUSUAL VAGINAL DISCHARGE OR ITCHING   Items with * indicate a potential emergency and should be followed up as soon as possible or go to the Emergency Department if any problems should occur.  Please show the CHEMOTHERAPY ALERT CARD or IMMUNOTHERAPY ALERT CARD at check-in to the  Emergency Department and triage nurse.  Should you have questions after your visit or need to cancel or reschedule your appointment, please contact [facility] [phone]  and follow the prompts.  Office hours are 8:00 a.m. to 4:30 p.m. Monday - Friday. Please note that voicemails left after 4:00 p.m. may not be returned until the following business day.  We are closed weekends and major holidays. You have access to a nurse at all times for urgent questions. Please call the main number to the clinic [phone] and follow the prompts.  For any non-urgent questions, you may also contact your provider using MyChart. We now offer e-Visits for anyone 68 and older to request care online for non-urgent symptoms. For details visit [email or web address].   Also download the MyChart app! Go to the app store, search "MyChart", open the app, select [facility], and log in with your MyChart username and password.  Masks are optional in the cancer centers. If you would like for your care team to wear a mask while they are taking care of you, please let them know. For doctor visits, patients may have with them one support person who is at least 48 years old. At this time, visitors are not allowed in the infusion area.

## 2021-07-30 ENCOUNTER — Other Ambulatory Visit: Payer: Self-pay

## 2021-08-02 ENCOUNTER — Inpatient Hospital Stay (HOSPITAL_COMMUNITY): Payer: BC Managed Care – PPO

## 2021-08-02 ENCOUNTER — Inpatient Hospital Stay (HOSPITAL_COMMUNITY): Payer: BC Managed Care – PPO | Admitting: Hematology

## 2021-08-02 ENCOUNTER — Other Ambulatory Visit: Payer: Self-pay

## 2021-08-02 ENCOUNTER — Encounter (HOSPITAL_COMMUNITY): Payer: Self-pay | Admitting: Hematology

## 2021-08-02 VITALS — BP 114/78 | HR 96 | Temp 98.6°F | Resp 18

## 2021-08-02 DIAGNOSIS — C50912 Malignant neoplasm of unspecified site of left female breast: Secondary | ICD-10-CM

## 2021-08-02 DIAGNOSIS — Z5111 Encounter for antineoplastic chemotherapy: Secondary | ICD-10-CM | POA: Diagnosis not present

## 2021-08-02 LAB — COMPREHENSIVE METABOLIC PANEL
ALT: 43 U/L (ref 0–44)
AST: 34 U/L (ref 15–41)
Albumin: 3.9 g/dL (ref 3.5–5.0)
Alkaline Phosphatase: 59 U/L (ref 38–126)
Anion gap: 7 (ref 5–15)
BUN: 10 mg/dL (ref 6–20)
CO2: 23 mmol/L (ref 22–32)
Calcium: 9.3 mg/dL (ref 8.9–10.3)
Chloride: 108 mmol/L (ref 98–111)
Creatinine, Ser: 0.65 mg/dL (ref 0.44–1.00)
GFR, Estimated: >60 mL/min (ref >60)
Glucose, Bld: 96 mg/dL (ref 70–99)
Potassium: 3.7 mmol/L (ref 3.5–5.1)
Sodium: 138 mmol/L (ref 135–145)
Total Bilirubin: 0.6 mg/dL (ref 0.3–1.2)
Total Protein: 6.7 g/dL (ref 6.5–8.1)

## 2021-08-02 LAB — CBC WITH DIFFERENTIAL/PLATELET
Abs Immature Granulocytes: 0.05 10*3/uL (ref 0.00–0.07)
Basophils Absolute: 0 10*3/uL (ref 0.0–0.1)
Basophils Relative: 1 %
Eosinophils Absolute: 0.2 10*3/uL (ref 0.0–0.5)
Eosinophils Relative: 3 %
HCT: 31.6 % — ABNORMAL LOW (ref 36.0–46.0)
Hemoglobin: 10.6 g/dL — ABNORMAL LOW (ref 12.0–15.0)
Immature Granulocytes: 1 %
Lymphocytes Relative: 14 %
Lymphs Abs: 0.7 10*3/uL (ref 0.7–4.0)
MCH: 32.2 pg (ref 26.0–34.0)
MCHC: 33.5 g/dL (ref 30.0–36.0)
MCV: 96 fL (ref 80.0–100.0)
Monocytes Absolute: 0.3 10*3/uL (ref 0.1–1.0)
Monocytes Relative: 7 %
Neutro Abs: 3.5 10*3/uL (ref 1.7–7.7)
Neutrophils Relative %: 74 %
Platelets: 251 10*3/uL (ref 150–400)
RBC: 3.29 MIL/uL — ABNORMAL LOW (ref 3.87–5.11)
RDW: 13.7 % (ref 11.5–15.5)
WBC: 4.7 10*3/uL (ref 4.0–10.5)
nRBC: 0 % (ref 0.0–0.2)

## 2021-08-02 MED ORDER — SODIUM CHLORIDE 0.9 % IV SOLN: Freq: Once | INTRAVENOUS | Status: AC

## 2021-08-02 MED ORDER — SODIUM CHLORIDE 0.9 % IV SOLN
10.0000 mg | Freq: Once | INTRAVENOUS | Status: AC
  Administered 2021-08-02: 10 mg via INTRAVENOUS
  Filled 2021-08-02: qty 10

## 2021-08-02 MED ORDER — HEPARIN SOD (PORK) LOCK FLUSH 100 UNIT/ML IV SOLN
500.0000 [IU] | Freq: Once | INTRAVENOUS | Status: AC | PRN
  Administered 2021-08-02: 500 [IU]

## 2021-08-02 MED ORDER — DIPHENHYDRAMINE HCL 50 MG/ML IJ SOLN
50.0000 mg | Freq: Once | INTRAMUSCULAR | Status: AC
  Administered 2021-08-02: 50 mg via INTRAVENOUS
  Filled 2021-08-02: qty 1

## 2021-08-02 MED ORDER — FAMOTIDINE IN NACL 20-0.9 MG/50ML-% IV SOLN
20.0000 mg | Freq: Once | INTRAVENOUS | Status: AC
  Administered 2021-08-02: 20 mg via INTRAVENOUS
  Filled 2021-08-02: qty 50

## 2021-08-02 MED ORDER — SODIUM CHLORIDE 0.9% FLUSH
10.0000 mL | INTRAVENOUS | Status: DC | PRN
  Administered 2021-08-02: 10 mL

## 2021-08-02 MED ORDER — SODIUM CHLORIDE 0.9 % IV SOLN
80.0000 mg/m2 | Freq: Once | INTRAVENOUS | Status: AC
  Administered 2021-08-02: 150 mg via INTRAVENOUS
  Filled 2021-08-02: qty 25

## 2021-08-02 NOTE — Progress Notes (Signed)
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 Matt Holmes  NGS Results: not done  CURRENT THERAPY: ADJUVANT DOSE DENSE AC q14d / PACLitaxel q7d  BRIEF ONCOLOGIC HISTORY:  Oncology History  Invasive lobular carcinoma of left breast in female [facility])  04/11/2021 Initial Diagnosis   Invasive lobular carcinoma of left breast in female [facility])   05/01/2021 -  Chemotherapy   Patient is on Treatment Plan : BREAST ADJUVANT DOSE DENSE AC q14d / PACLitaxel q7d       CANCER STAGING:  Cancer Staging  Invasive lobular carcinoma of left breast in female [facility]) Staging form: Breast, AJCC 8th Edition - Clinical stage from 04/11/2021: Stage IIA (cT2, cN1, cM0, G2, ER+, PR+, HER2-) - Unsigned   INTERVAL HISTORY:  [name], a 46 y.o. female, returns for routine follow-up and consideration for next cycle of chemotherapy. Debra Vaughn was last seen on 07/12/2021.  Due for cycle #6 of Taxol today.   Overall, she tells me she has been feeling pretty well. She continues to work. She denies tingling/numbness. She reports her fingerprints have changed and can no longer open her fingerprint scanner on her phone. She reports joint pains in her knees and hips. She denies pain in her calves. She reports fatigue for 1-2 days following treatment which improved if she has the opportunity to rest over the weekend. She denies n/v/d/c, skin rashes, and ankle swellings.   Overall, she feels ready for next cycle of chemo today.    REVIEW OF SYSTEMS:  Review of Systems  Constitutional:  Negative for appetite change and fatigue.  Cardiovascular:  Negative for leg swelling.  Gastrointestinal:  Negative for constipation,  diarrhea, nausea and vomiting.  Musculoskeletal:  Positive for arthralgias.  Skin:  Negative for rash.  Neurological:  Negative for numbness.  All other systems reviewed and are negative.   PAST MEDICAL/SURGICAL HISTORY:  No past medical history on file. Past Surgical History:  Procedure Laterality Date   BREAST BIOPSY Right 04/06/2021    SOCIAL HISTORY:  Social History   Socioeconomic History   Marital status: Married    Spouse name: Not on file   Number of children: Not on file   Years of education: Not on file   Highest education level: Not on file  Occupational History   Not on file  Tobacco Use   Smoking status: Never   Smokeless tobacco: Never  Vaping Use   Vaping Use: Never used  Substance and Sexual Activity   Alcohol use: Not Currently    Comment: occasionally   Drug use: Never   Sexual activity: Not Currently  Other Topics Concern   Not on file  Social History Narrative   Not on file   Social Determinants of Health   Financial Resource Strain: Not on file  Food Insecurity: Not on file  Transportation Needs: Not on file  Physical Activity: Not on file  Stress: Not on file  Social Connections: Not on file  Intimate Partner Violence: Not on file    FAMILY HISTORY:  No family history on file.  CURRENT MEDICATIONS:  Current Outpatient Medications  Medication Sig Dispense Refill   busPIRone (BUSPAR) 7.5 MG tablet Take 7.5  mg by mouth 3 (three) times daily.     FLUoxetine (PROZAC) 20 MG capsule Take 60 mg by mouth every morning.     Investigational dexamethasone 4 MG tablet [facility] 16070 Blister Card 2 Take 2 tablets by mouth daily. Take in the morning on Days 2-4. 6 tablet 0   Investigational olanzapine/placebo 10 MG capsule [facility] 16070 Blister Card 2 Take 1 capsule by mouth daily. Take in the morning on Days 2-4. 3 capsule 0   Investigational prochlorperazine maleate/placebo 10 MG capsule [facility] 16070 Blister Card 2 Take 1 capsule by mouth every 8 (eight)  hours. Take on Days 1-4. 11 capsule 0   levonorgestrel (MIRENA, 52 MG,) 20 MCG/DAY IUD by Intrauterine route.     lidocaine-prilocaine (EMLA) cream Apply a small amount to port a cath site and cover with plastic wrap 1 hour prior to infusion appointments 30 g 3   prochlorperazine (COMPAZINE) 10 MG tablet Take 1 tablet (10 mg total) by mouth every 6 (six) hours as needed (Nausea or vomiting). 60 tablet 3   sucralfate (CARAFATE) 1 GM/10ML suspension Take 10 mLs (1 g total) by mouth 4 (four) times daily. Swish and swallow. 360 mL 2   No current facility-administered medications for this visit.   Facility-Administered Medications Ordered in Other Visits  Medication Dose Route Frequency Provider Last Rate Last Admin   octreotide (SANDOSTATIN LAR) 30 MG IM injection            sodium chloride flush (NS) 0.9 % injection 10 mL  10 mL Intravenous PRN Derek Jack, MD   10 mL at 05/08/21 1235    ALLERGIES:  Allergies  Allergen Reactions   Bupropion Hives    Other reaction(s): seizure    PHYSICAL EXAM:  Performance status (ECOG): 0 - Asymptomatic  There were no vitals filed for this visit. Wt Readings from Last 3 Encounters:  07/26/21 161 lb 9.6 oz (73.3 kg)  07/19/21 161 lb 8 oz (73.3 kg)  07/12/21 161 lb 3.2 oz (73.1 kg)   Physical Exam Vitals reviewed.  Constitutional:      Appearance: Normal appearance.  Cardiovascular:     Rate and Rhythm: Normal rate and regular rhythm.     Pulses: Normal pulses.     Heart sounds: Normal heart sounds.  Pulmonary:     Effort: Pulmonary effort is normal.     Breath sounds: Normal breath sounds.  Musculoskeletal:     Right lower leg: No edema.     Left lower leg: No edema.  Neurological:     General: No focal deficit present.     Mental Status: She is alert and oriented to person, place, and time.  Psychiatric:        Mood and Affect: Mood normal.        Behavior: Behavior normal.     LABORATORY DATA:  I have reviewed the labs as  listed.     Latest Ref Rng & Units 07/26/2021    7:54 AM 07/19/2021    7:53 AM 07/12/2021    9:06 AM  CBC  WBC 4.0 - 10.5 K/uL 5.6  5.3  4.9   Hemoglobin 12.0 - 15.0 g/dL 10.0  9.8  9.7   Hematocrit 36.0 - 46.0 % 29.6  29.1  29.2   Platelets 150 - 400 K/uL 210  280  260       Latest Ref Rng & Units 07/26/2021    7:54 AM 07/19/2021    7:53 AM 07/12/2021  9:06 AM  CMP  Glucose 70 - 99 mg/dL 92  112  110   BUN 6 - 20 mg/dL 10  9  8    Creatinine 0.44 - 1.00 mg/dL 0.56  0.64  0.60   Sodium 135 - 145 mmol/L 138  139  138   Potassium 3.5 - 5.1 mmol/L 3.7  3.6  3.8   Chloride 98 - 111 mmol/L 109  109  109   CO2 22 - 32 mmol/L 24  26  24    Calcium 8.9 - 10.3 mg/dL 9.0  8.9  9.0   Total Protein 6.5 - 8.1 g/dL 6.7  6.6  6.8   Total Bilirubin 0.3 - 1.2 mg/dL 0.5  0.7  0.6   Alkaline Phos 38 - 126 U/L 61  64  63   AST 15 - 41 U/L 25  39  43   ALT 0 - 44 U/L 38  67  59     DIAGNOSTIC IMAGING:  I have independently reviewed the scans and discussed with the patient. No results found.   ASSESSMENT:  Stage II T2N1 left breast invasive lobular carcinoma, ER/PR positive and HER2 negative: - She felt the lump in her left breast before Thanksgiving in 2022.  She missed mammograms for few years prior to that. - Mammogram was done in [facility]. - Left breast biopsy on 01/10/2021: Invasive lobular carcinoma, intermediate grade, ER/PR 90% positive, Ki-67 5 to 10% positive, HER2 negative - Left breast lumpectomy and SLNB on 02/01/2021 by Dr. Matt Holmes - Pathology: Multifocal infiltrating lobular carcinoma, largest focus measuring 3.5 cm, multiple additional nodules present in the specimen particularly in the medial and lateral margins, grade 2, invasive tumor at the inked medial margin, 2/2 positive axillary lymph nodes, Ki-67 variable (40-50% with a number of tumor nodules stain and examined), ER/PR positive, HER2 negative.  Additional anterior medial margin was negative.  No tumor seen at the left  breast deep margin tissue. - Patient reportedly had genetic testing done and was told BRCA -7 years ago in [place]. - MRI of the breast on 03/30/2021: Mildly prominent left subpectoral lymph node measuring 8 mm.  Solitary prominent left axillary lymph node.  5 mm mass in the upper inner quadrant of the right breast. - Right breast biopsy: Pseudoangiomatous stromal hyperplasia, blunt duct adenosis, no malignancy identified.  Left breast intramammary lymph node shows nodal tissue with pigmented histiocytes.  No malignancy. - Oncotype DX: RS-26.  Distal recurrence risk at 9 years on tamoxifen was 21%. - Dose dense AC started on 05/01/2021 - PET scan on 04/20/2021: Postsurgical changes in both breasts without metabolic activity for residual tumor.  Small focus of faint hypermetabolic activity within the left anterior chest wall involving the pectoralis muscle or subpectoral space without corresponding CT finding.  No evidence of distant metastatic disease.    Social/family history: - She lives in [place] with her husband.  She works as a Licensed conveyancer.  She is a non-smoker. - Mother had breast cancer at age 61.  Paternal grandmother had thyroid cancer.   PLAN:  Stage II T2N1 invasive lobular carcinoma of left breast: - She is tolerating weekly paclitaxel reasonably well. - She has tiredness for few days after each treatment. - Denies any GI symptoms.  Denies any neuropathy. - Reviewed labs today which showed normal LFTs.  CBC was grossly normal. - Continue with week 6 of Taxol today and weekly. - RTC 3 weeks for follow-up with repeat labs. - After completion of Taxol  we will also start her on antiestrogen therapy. - She will be referred to radiation oncology in [place] upon completion of chemotherapy.  2.  Anxiety/depression: - Continue Prozac 60 mg daily and BuSpar 3 times daily as needed.   Orders placed this encounter:  No orders of the defined types were placed in this  encounter.    Derek Jack, MD [facility] [phone]   I, Thana Ates, am acting as a scribe for Dr. Derek Jack.  I, Derek Jack MD, have reviewed the above documentation for accuracy and completeness, and I agree with the above.

## 2021-08-02 NOTE — Progress Notes (Signed)
Labs reviewed today, will proceed with treatment per MD.  Treatment given per orders. Patient tolerated it well without problems. Vitals stable and discharged home from clinic ambulatory. Follow up as scheduled.

## 2021-08-02 NOTE — Patient Instructions (Signed)
[place]  Discharge Instructions: Thank you for choosing [facility] to provide your oncology and hematology care.  If you have a lab appointment with the [facility], please come in thru the Main Entrance and check in at the main information desk.  Wear comfortable clothing and clothing appropriate for easy access to any Portacath or PICC line.   We strive to give you quality time with your provider. You may need to reschedule your appointment if you arrive late (15 or more minutes).  Arriving late affects you and other patients whose appointments are after yours.  Also, if you miss three or more appointments without notifying the office, you may be dismissed from the clinic at the provider's discretion.      For prescription refill requests, have your pharmacy contact our office and allow 72 hours for refills to be completed.    Today you received the following chemotherapy and/or immunotherapy agents taxol      To help prevent nausea and vomiting after your treatment, we encourage you to take your nausea medication as directed.  BELOW ARE SYMPTOMS THAT SHOULD BE REPORTED IMMEDIATELY: *FEVER GREATER THAN 100.4 F (38 C) OR HIGHER *CHILLS OR SWEATING *NAUSEA AND VOMITING THAT IS NOT CONTROLLED WITH YOUR NAUSEA MEDICATION *UNUSUAL SHORTNESS OF BREATH *UNUSUAL BRUISING OR BLEEDING *URINARY PROBLEMS (pain or burning when urinating, or frequent urination) *BOWEL PROBLEMS (unusual diarrhea, constipation, pain near the anus) TENDERNESS IN MOUTH AND THROAT WITH OR WITHOUT PRESENCE OF ULCERS (sore throat, sores in mouth, or a toothache) UNUSUAL RASH, SWELLING OR PAIN  UNUSUAL VAGINAL DISCHARGE OR ITCHING   Items with * indicate a potential emergency and should be followed up as soon as possible or go to the Emergency Department if any problems should occur.  Please show the CHEMOTHERAPY ALERT CARD or IMMUNOTHERAPY ALERT CARD at check-in to the Emergency Department  and triage nurse.  Should you have questions after your visit or need to cancel or reschedule your appointment, please contact [facility] [place] [phone]  and follow the prompts.  Office hours are 8:00 a.m. to 4:30 p.m. Monday - Friday. Please note that voicemails left after 4:00 p.m. may not be returned until the following business day.  We are closed weekends and major holidays. You have access to a nurse at all times for urgent questions. Please call the main number to the clinic [phone] and follow the prompts.  For any non-urgent questions, you may also contact your provider using MyChart. We now offer e-Visits for anyone 18 and older to request care online for non-urgent symptoms. For details visit [email or web address].   Also download the MyChart app! Go to the app store, search "MyChart", open the app, select [facility] [place], and log in with your MyChart username and password.  Masks are optional in the cancer centers. If you would like for your care team to wear a mask while they are taking care of you, please let them know. For doctor visits, patients may have with them one support person who is at least 48 years old. At this time, visitors are not allowed in the infusion area.  Paclitaxel injection What is this medication? PACLITAXEL (PAK li TAX el) is a chemotherapy drug. It targets fast dividing cells, like cancer cells, and causes these cells to die. This medicine is used to treat ovarian cancer, breast cancer, lung cancer, Kaposi's sarcoma, and other cancers. This medicine may be used for other purposes; ask your health care provider  or pharmacist if you have questions. COMMON BRAND NAME(S): Onxol, Taxol What should I tell my care team before I take this medication? They need to know if you have any of these conditions: history of irregular heartbeat liver disease low blood counts, like low white cell, platelet, or red cell counts lung or breathing disease, like  asthma tingling of the fingers or toes, or other nerve disorder an unusual or allergic reaction to paclitaxel, alcohol, polyoxyethylated castor oil, other chemotherapy, other medicines, foods, dyes, or preservatives pregnant or trying to get pregnant breast-feeding How should I use this medication? This drug is given as an infusion into a vein. It is administered in a hospital or clinic by a specially trained health care professional. Talk to your pediatrician regarding the use of this medicine in children. Special care may be needed. Overdosage: If you think you have taken too much of this medicine contact a poison control center or emergency room at once. NOTE: This medicine is only for you. Do not share this medicine with others. What if I miss a dose? It is important not to miss your dose. Call your doctor or health care professional if you are unable to keep an appointment. What may interact with this medication? Do not take this medicine with any of the following medications: live virus vaccines This medicine may also interact with the following medications: antiviral medicines for hepatitis, HIV or AIDS certain antibiotics like erythromycin and clarithromycin certain medicines for fungal infections like ketoconazole and itraconazole certain medicines for seizures like carbamazepine, phenobarbital, phenytoin gemfibrozil nefazodone rifampin St. John's wort This list may not describe all possible interactions. Give your health care provider a list of all the medicines, herbs, non-prescription drugs, or dietary supplements you use. Also tell them if you smoke, drink alcohol, or use illegal drugs. Some items may interact with your medicine. What should I watch for while using this medication? Your condition will be monitored carefully while you are receiving this medicine. You will need important blood work done while you are taking this medicine. This medicine can cause serious allergic  reactions. To reduce your risk you will need to take other medicine(s) before treatment with this medicine. If you experience allergic reactions like skin rash, itching or hives, swelling of the face, lips, or tongue, tell your doctor or health care professional right away. In some cases, you may be given additional medicines to help with side effects. Follow all directions for their use. This drug may make you feel generally unwell. This is not uncommon, as chemotherapy can affect healthy cells as well as cancer cells. Report any side effects. Continue your course of treatment even though you feel ill unless your doctor tells you to stop. Call your doctor or health care professional for advice if you get a fever, chills or sore throat, or other symptoms of a cold or flu. Do not treat yourself. This drug decreases your body's ability to fight infections. Try to avoid being around people who are sick. This medicine may increase your risk to bruise or bleed. Call your doctor or health care professional if you notice any unusual bleeding. Be careful brushing and flossing your teeth or using a toothpick because you may get an infection or bleed more easily. If you have any dental work done, tell your dentist you are receiving this medicine. Avoid taking products that contain aspirin, acetaminophen, ibuprofen, naproxen, or ketoprofen unless instructed by your doctor. These medicines may hide a fever. Do not become  pregnant while taking this medicine. Women should inform their doctor if they wish to become pregnant or think they might be pregnant. There is a potential for serious side effects to an unborn child. Talk to your health care professional or pharmacist for more information. Do not breast-feed an infant while taking this medicine. Men are advised not to father a child while receiving this medicine. This product may contain alcohol. Ask your pharmacist or healthcare provider if this medicine contains  alcohol. Be sure to tell all healthcare providers you are taking this medicine. Certain medicines, like metronidazole and disulfiram, can cause an unpleasant reaction when taken with alcohol. The reaction includes flushing, headache, nausea, vomiting, sweating, and increased thirst. The reaction can last from 30 minutes to several hours. What side effects may I notice from receiving this medication? Side effects that you should report to your doctor or health care professional as soon as possible: allergic reactions like skin rash, itching or hives, swelling of the face, lips, or tongue breathing problems changes in vision fast, irregular heartbeat high or low blood pressure mouth sores pain, tingling, numbness in the hands or feet signs of decreased platelets or bleeding - bruising, pinpoint red spots on the skin, black, tarry stools, blood in the urine signs of decreased red blood cells - unusually weak or tired, feeling faint or lightheaded, falls signs of infection - fever or chills, cough, sore throat, pain or difficulty passing urine signs and symptoms of liver injury like dark yellow or brown urine; general ill feeling or flu-like symptoms; light-colored stools; loss of appetite; nausea; right upper belly pain; unusually weak or tired; yellowing of the eyes or skin swelling of the ankles, feet, hands unusually slow heartbeat Side effects that usually do not require medical attention (report to your doctor or health care professional if they continue or are bothersome): diarrhea hair loss loss of appetite muscle or joint pain nausea, vomiting pain, redness, or irritation at site where injected tiredness This list may not describe all possible side effects. Call your doctor for medical advice about side effects. You may report side effects to FDA at 1-800-FDA-1088. Where should I keep my medication? This drug is given in a hospital or clinic and will not be stored at home. NOTE: This  sheet is a summary. It may not cover all possible information. If you have questions about this medicine, talk to your doctor, pharmacist, or health care provider.  2023 Elsevier/Gold Standard (2020-11-24 00:00:00)

## 2021-08-02 NOTE — Patient Instructions (Signed)
[facility] at [facility] Discharge Instructions   You were seen and examined today by Dr. Katragadda.  He reviewed the results of your lab work which are normal/stable.   We will proceed with your treatment today.  Return as scheduled.    Thank you for choosing [facility] at [facility] to provide your oncology and hematology care.  To afford each patient quality time with our provider, please arrive at least 15 minutes before your scheduled appointment time.   If you have a lab appointment with the Cancer Center please come in thru the Main Entrance and check in at the main information desk.  You need to re-schedule your appointment should you arrive 10 or more minutes late.  We strive to give you quality time with our providers, and arriving late affects you and other patients whose appointments are after yours.  Also, if you no show three or more times for appointments you may be dismissed from the clinic at the providers discretion.     Again, thank you for choosing [facility].  Our hope is that these requests will decrease the amount of time that you wait before being seen by our physicians.       _____________________________________________________________  Should you have questions after your visit to [facility], please contact our office at [phone] and follow the prompts.  Our office hours are 8:00 a.m. and 4:30 p.m. Monday - Friday.  Please note that voicemails left after 4:00 p.m. may not be returned until the following business day.  We are closed weekends and major holidays.  You do have access to a nurse 24-7, just call the main number to the clinic [phone] and do not press any options, hold on the line and a nurse will answer the phone.    For prescription refill requests, have your pharmacy contact our office and allow 72 hours.    Due to Covid, you will need to wear a mask upon entering  the hospital. If you do not have a mask, a mask will be given to you at the Main Entrance upon arrival. For doctor visits, patients may have 1 support person age 18 or older with them. For treatment visits, patients can not have anyone with them due to social distancing guidelines and our immunocompromised population.      

## 2021-08-03 ENCOUNTER — Other Ambulatory Visit: Payer: Self-pay

## 2021-08-07 ENCOUNTER — Other Ambulatory Visit: Payer: Self-pay

## 2021-08-07 DIAGNOSIS — C50912 Malignant neoplasm of unspecified site of left female breast: Secondary | ICD-10-CM

## 2021-08-08 ENCOUNTER — Inpatient Hospital Stay: Payer: BC Managed Care – PPO

## 2021-08-08 ENCOUNTER — Inpatient Hospital Stay: Payer: BC Managed Care – PPO | Attending: Hematology

## 2021-08-08 VITALS — BP 119/71 | HR 92 | Temp 97.7°F | Resp 18

## 2021-08-08 DIAGNOSIS — Z170 Estrogen receptor positive status [ER+]: Secondary | ICD-10-CM | POA: Diagnosis not present

## 2021-08-08 DIAGNOSIS — Z5111 Encounter for antineoplastic chemotherapy: Secondary | ICD-10-CM | POA: Diagnosis present

## 2021-08-08 DIAGNOSIS — D649 Anemia, unspecified: Secondary | ICD-10-CM | POA: Diagnosis not present

## 2021-08-08 DIAGNOSIS — C50912 Malignant neoplasm of unspecified site of left female breast: Secondary | ICD-10-CM

## 2021-08-08 LAB — COMPREHENSIVE METABOLIC PANEL
ALT: 86 U/L — ABNORMAL HIGH (ref 0–44)
AST: 65 U/L — ABNORMAL HIGH (ref 15–41)
Albumin: 3.7 g/dL (ref 3.5–5.0)
Alkaline Phosphatase: 57 U/L (ref 38–126)
Anion gap: 6 (ref 5–15)
BUN: 10 mg/dL (ref 6–20)
CO2: 23 mmol/L (ref 22–32)
Calcium: 8.9 mg/dL (ref 8.9–10.3)
Chloride: 109 mmol/L (ref 98–111)
Creatinine, Ser: 0.62 mg/dL (ref 0.44–1.00)
GFR, Estimated: >60 mL/min (ref >60)
Glucose, Bld: 96 mg/dL (ref 70–99)
Potassium: 3.5 mmol/L (ref 3.5–5.1)
Sodium: 138 mmol/L (ref 135–145)
Total Bilirubin: 0.6 mg/dL (ref 0.3–1.2)
Total Protein: 6.6 g/dL (ref 6.5–8.1)

## 2021-08-08 LAB — MAGNESIUM: Magnesium: 1.9 mg/dL (ref 1.7–2.4)

## 2021-08-08 LAB — CBC WITH DIFFERENTIAL/PLATELET
Abs Immature Granulocytes: 0.03 10*3/uL (ref 0.00–0.07)
Basophils Absolute: 0 10*3/uL (ref 0.0–0.1)
Basophils Relative: 1 %
Eosinophils Absolute: 0.1 10*3/uL (ref 0.0–0.5)
Eosinophils Relative: 3 %
HCT: 29.5 % — ABNORMAL LOW (ref 36.0–46.0)
Hemoglobin: 10.1 g/dL — ABNORMAL LOW (ref 12.0–15.0)
Immature Granulocytes: 1 %
Lymphocytes Relative: 16 %
Lymphs Abs: 0.6 10*3/uL — ABNORMAL LOW (ref 0.7–4.0)
MCH: 32.7 pg (ref 26.0–34.0)
MCHC: 34.2 g/dL (ref 30.0–36.0)
MCV: 95.5 fL (ref 80.0–100.0)
Monocytes Absolute: 0.3 10*3/uL (ref 0.1–1.0)
Monocytes Relative: 7 %
Neutro Abs: 2.4 10*3/uL (ref 1.7–7.7)
Neutrophils Relative %: 72 %
Platelets: 270 10*3/uL (ref 150–400)
RBC: 3.09 MIL/uL — ABNORMAL LOW (ref 3.87–5.11)
RDW: 13.3 % (ref 11.5–15.5)
WBC: 3.4 10*3/uL — ABNORMAL LOW (ref 4.0–10.5)
nRBC: 0 % (ref 0.0–0.2)

## 2021-08-08 MED ORDER — SODIUM CHLORIDE 0.9 % IV SOLN: Freq: Once | INTRAVENOUS | Status: AC

## 2021-08-08 MED ORDER — HEPARIN SOD (PORK) LOCK FLUSH 100 UNIT/ML IV SOLN
500.0000 [IU] | Freq: Once | INTRAVENOUS | Status: AC | PRN
  Administered 2021-08-08: 500 [IU]

## 2021-08-08 MED ORDER — DIPHENHYDRAMINE HCL 50 MG/ML IJ SOLN
50.0000 mg | Freq: Once | INTRAMUSCULAR | Status: AC
  Administered 2021-08-08: 50 mg via INTRAVENOUS
  Filled 2021-08-08: qty 1

## 2021-08-08 MED ORDER — FAMOTIDINE IN NACL 20-0.9 MG/50ML-% IV SOLN
20.0000 mg | Freq: Once | INTRAVENOUS | Status: AC
  Administered 2021-08-08: 20 mg via INTRAVENOUS
  Filled 2021-08-08: qty 50

## 2021-08-08 MED ORDER — SODIUM CHLORIDE 0.9 % IV SOLN
80.0000 mg/m2 | Freq: Once | INTRAVENOUS | Status: AC
  Administered 2021-08-08: 150 mg via INTRAVENOUS
  Filled 2021-08-08: qty 25

## 2021-08-08 MED ORDER — SODIUM CHLORIDE 0.9% FLUSH
10.0000 mL | INTRAVENOUS | Status: DC | PRN
  Administered 2021-08-08: 10 mL

## 2021-08-08 MED ORDER — SODIUM CHLORIDE 0.9 % IV SOLN
10.0000 mg | Freq: Once | INTRAVENOUS | Status: AC
  Administered 2021-08-08: 10 mg via INTRAVENOUS
  Filled 2021-08-08: qty 10

## 2021-08-08 NOTE — Patient Instructions (Signed)
[place]  Discharge Instructions: Thank you for choosing [facility] to provide your oncology and hematology care.  If you have a lab appointment with the [facility], please come in thru the Main Entrance and check in at the main information desk.  Wear comfortable clothing and clothing appropriate for easy access to any Portacath or PICC line.   We strive to give you quality time with your provider. You may need to reschedule your appointment if you arrive late (15 or more minutes).  Arriving late affects you and other patients whose appointments are after yours.  Also, if you miss three or more appointments without notifying the office, you may be dismissed from the clinic at the provider's discretion.      For prescription refill requests, have your pharmacy contact our office and allow 72 hours for refills to be completed.    Today you received the following chemotherapy and/or immunotherapy agents Taxol   To help prevent nausea and vomiting after your treatment, we encourage you to take your nausea medication as directed.  BELOW ARE SYMPTOMS THAT SHOULD BE REPORTED IMMEDIATELY: *FEVER GREATER THAN 100.4 F (38 C) OR HIGHER *CHILLS OR SWEATING *NAUSEA AND VOMITING THAT IS NOT CONTROLLED WITH YOUR NAUSEA MEDICATION *UNUSUAL SHORTNESS OF BREATH *UNUSUAL BRUISING OR BLEEDING *URINARY PROBLEMS (pain or burning when urinating, or frequent urination) *BOWEL PROBLEMS (unusual diarrhea, constipation, pain near the anus) TENDERNESS IN MOUTH AND THROAT WITH OR WITHOUT PRESENCE OF ULCERS (sore throat, sores in mouth, or a toothache) UNUSUAL RASH, SWELLING OR PAIN  UNUSUAL VAGINAL DISCHARGE OR ITCHING   Items with * indicate a potential emergency and should be followed up as soon as possible or go to the Emergency Department if any problems should occur.  Please show the CHEMOTHERAPY ALERT CARD or IMMUNOTHERAPY ALERT CARD at check-in to the Emergency  Department and triage nurse.  Should you have questions after your visit or need to cancel or reschedule your appointment, please contact [facility] [phone]  and follow the prompts.  Office hours are 8:00 a.m. to 4:30 p.m. Monday - Friday. Please note that voicemails left after 4:00 p.m. may not be returned until the following business day.  We are closed weekends and major holidays. You have access to a nurse at all times for urgent questions. Please call the main number to the clinic [phone] and follow the prompts.  For any non-urgent questions, you may also contact your provider using MyChart. We now offer e-Visits for anyone 28 and older to request care online for non-urgent symptoms. For details visit [email or web address].   Also download the MyChart app! Go to the app store, search "MyChart", open the app, select [facility], and log in with your MyChart username and password.  Masks are optional in the cancer centers. If you would like for your care team to wear a mask while they are taking care of you, please let them know. For doctor visits, patients may have with them one support person who is at least 48 years old. At this time, visitors are not allowed in the infusion area.  Paclitaxel injection What is this medication? PACLITAXEL (PAK li TAX el) is a chemotherapy drug. It targets fast dividing cells, like cancer cells, and causes these cells to die. This medicine is used to treat ovarian cancer, breast cancer, lung cancer, Kaposi's sarcoma, and other cancers. This medicine may be used for other purposes; ask your health care provider or  pharmacist if you have questions. COMMON BRAND NAME(S): Onxol, Taxol What should I tell my care team before I take this medication? They need to know if you have any of these conditions: history of irregular heartbeat liver disease low blood counts, like low white cell, platelet, or red cell counts lung or breathing  disease, like asthma tingling of the fingers or toes, or other nerve disorder an unusual or allergic reaction to paclitaxel, alcohol, polyoxyethylated castor oil, other chemotherapy, other medicines, foods, dyes, or preservatives pregnant or trying to get pregnant breast-feeding How should I use this medication? This drug is given as an infusion into a vein. It is administered in a hospital or clinic by a specially trained health care professional. Talk to your pediatrician regarding the use of this medicine in children. Special care may be needed. Overdosage: If you think you have taken too much of this medicine contact a poison control center or emergency room at once. NOTE: This medicine is only for you. Do not share this medicine with others. What if I miss a dose? It is important not to miss your dose. Call your doctor or health care professional if you are unable to keep an appointment. What may interact with this medication? Do not take this medicine with any of the following medications: live virus vaccines This medicine may also interact with the following medications: antiviral medicines for hepatitis, HIV or AIDS certain antibiotics like erythromycin and clarithromycin certain medicines for fungal infections like ketoconazole and itraconazole certain medicines for seizures like carbamazepine, phenobarbital, phenytoin gemfibrozil nefazodone rifampin St. John's wort This list may not describe all possible interactions. Give your health care provider a list of all the medicines, herbs, non-prescription drugs, or dietary supplements you use. Also tell them if you smoke, drink alcohol, or use illegal drugs. Some items may interact with your medicine. What should I watch for while using this medication? Your condition will be monitored carefully while you are receiving this medicine. You will need important blood work done while you are taking this medicine. This medicine can cause  serious allergic reactions. To reduce your risk you will need to take other medicine(s) before treatment with this medicine. If you experience allergic reactions like skin rash, itching or hives, swelling of the face, lips, or tongue, tell your doctor or health care professional right away. In some cases, you may be given additional medicines to help with side effects. Follow all directions for their use. This drug may make you feel generally unwell. This is not uncommon, as chemotherapy can affect healthy cells as well as cancer cells. Report any side effects. Continue your course of treatment even though you feel ill unless your doctor tells you to stop. Call your doctor or health care professional for advice if you get a fever, chills or sore throat, or other symptoms of a cold or flu. Do not treat yourself. This drug decreases your body's ability to fight infections. Try to avoid being around people who are sick. This medicine may increase your risk to bruise or bleed. Call your doctor or health care professional if you notice any unusual bleeding. Be careful brushing and flossing your teeth or using a toothpick because you may get an infection or bleed more easily. If you have any dental work done, tell your dentist you are receiving this medicine. Avoid taking products that contain aspirin, acetaminophen, ibuprofen, naproxen, or ketoprofen unless instructed by your doctor. These medicines may hide a fever. Do not become pregnant  while taking this medicine. Women should inform their doctor if they wish to become pregnant or think they might be pregnant. There is a potential for serious side effects to an unborn child. Talk to your health care professional or pharmacist for more information. Do not breast-feed an infant while taking this medicine. Men are advised not to father a child while receiving this medicine. This product may contain alcohol. Ask your pharmacist or healthcare provider if this  medicine contains alcohol. Be sure to tell all healthcare providers you are taking this medicine. Certain medicines, like metronidazole and disulfiram, can cause an unpleasant reaction when taken with alcohol. The reaction includes flushing, headache, nausea, vomiting, sweating, and increased thirst. The reaction can last from 30 minutes to several hours. What side effects may I notice from receiving this medication? Side effects that you should report to your doctor or health care professional as soon as possible: allergic reactions like skin rash, itching or hives, swelling of the face, lips, or tongue breathing problems changes in vision fast, irregular heartbeat high or low blood pressure mouth sores pain, tingling, numbness in the hands or feet signs of decreased platelets or bleeding - bruising, pinpoint red spots on the skin, black, tarry stools, blood in the urine signs of decreased red blood cells - unusually weak or tired, feeling faint or lightheaded, falls signs of infection - fever or chills, cough, sore throat, pain or difficulty passing urine signs and symptoms of liver injury like dark yellow or brown urine; general ill feeling or flu-like symptoms; light-colored stools; loss of appetite; nausea; right upper belly pain; unusually weak or tired; yellowing of the eyes or skin swelling of the ankles, feet, hands unusually slow heartbeat Side effects that usually do not require medical attention (report to your doctor or health care professional if they continue or are bothersome): diarrhea hair loss loss of appetite muscle or joint pain nausea, vomiting pain, redness, or irritation at site where injected tiredness This list may not describe all possible side effects. Call your doctor for medical advice about side effects. You may report side effects to FDA at 1-800-FDA-1088. Where should I keep my medication? This drug is given in a hospital or clinic and will not be stored at  home. NOTE: This sheet is a summary. It may not cover all possible information. If you have questions about this medicine, talk to your doctor, pharmacist, or health care provider.  2023 Elsevier/Gold Standard (2020-11-24 00:00:00)

## 2021-08-08 NOTE — Progress Notes (Signed)
Pt presents today for Taxol per provider's order. Vital signs and labs WNL for treatment. Okay to proceed with treatment today.  Taxol given today per MD orders. Tolerated infusion without adverse affects. Vital signs stable. No complaints at this time. Discharged from clinic ambulatory in stable condition. Alert and oriented x 3. F/U with [facility] as scheduled.   

## 2021-08-08 NOTE — Progress Notes (Signed)
Patients port flushed without difficulty.  Good blood return noted with no bruising or swelling noted at site.  Patient remains accessed for chemotherapy treatment.  

## 2021-08-15 ENCOUNTER — Inpatient Hospital Stay: Payer: BC Managed Care – PPO

## 2021-08-15 VITALS — BP 112/74 | HR 93 | Temp 97.2°F | Resp 18 | Wt 161.8 lb

## 2021-08-15 DIAGNOSIS — Z5111 Encounter for antineoplastic chemotherapy: Secondary | ICD-10-CM | POA: Diagnosis not present

## 2021-08-15 DIAGNOSIS — C50912 Malignant neoplasm of unspecified site of left female breast: Secondary | ICD-10-CM

## 2021-08-15 LAB — MAGNESIUM: Magnesium: 2 mg/dL (ref 1.7–2.4)

## 2021-08-15 LAB — CBC WITH DIFFERENTIAL/PLATELET
Abs Immature Granulocytes: 0.06 10*3/uL (ref 0.00–0.07)
Basophils Absolute: 0 10*3/uL (ref 0.0–0.1)
Basophils Relative: 1 %
Eosinophils Absolute: 0.1 10*3/uL (ref 0.0–0.5)
Eosinophils Relative: 2 %
HCT: 31.5 % — ABNORMAL LOW (ref 36.0–46.0)
Hemoglobin: 10.7 g/dL — ABNORMAL LOW (ref 12.0–15.0)
Immature Granulocytes: 1 %
Lymphocytes Relative: 17 %
Lymphs Abs: 0.7 10*3/uL (ref 0.7–4.0)
MCH: 32.3 pg (ref 26.0–34.0)
MCHC: 34 g/dL (ref 30.0–36.0)
MCV: 95.2 fL (ref 80.0–100.0)
Monocytes Absolute: 0.3 10*3/uL (ref 0.1–1.0)
Monocytes Relative: 7 %
Neutro Abs: 3.1 10*3/uL (ref 1.7–7.7)
Neutrophils Relative %: 72 %
Platelets: 287 10*3/uL (ref 150–400)
RBC: 3.31 MIL/uL — ABNORMAL LOW (ref 3.87–5.11)
RDW: 13.1 % (ref 11.5–15.5)
WBC: 4.3 10*3/uL (ref 4.0–10.5)
nRBC: 0 % (ref 0.0–0.2)

## 2021-08-15 LAB — COMPREHENSIVE METABOLIC PANEL
ALT: 68 U/L — ABNORMAL HIGH (ref 0–44)
AST: 43 U/L — ABNORMAL HIGH (ref 15–41)
Albumin: 3.7 g/dL (ref 3.5–5.0)
Alkaline Phosphatase: 64 U/L (ref 38–126)
Anion gap: 6 (ref 5–15)
BUN: 9 mg/dL (ref 6–20)
CO2: 24 mmol/L (ref 22–32)
Calcium: 9 mg/dL (ref 8.9–10.3)
Chloride: 108 mmol/L (ref 98–111)
Creatinine, Ser: 0.65 mg/dL (ref 0.44–1.00)
GFR, Estimated: >60 mL/min (ref >60)
Glucose, Bld: 140 mg/dL — ABNORMAL HIGH (ref 70–99)
Potassium: 3.7 mmol/L (ref 3.5–5.1)
Sodium: 138 mmol/L (ref 135–145)
Total Bilirubin: 0.6 mg/dL (ref 0.3–1.2)
Total Protein: 6.8 g/dL (ref 6.5–8.1)

## 2021-08-15 MED ORDER — FAMOTIDINE IN NACL 20-0.9 MG/50ML-% IV SOLN
20.0000 mg | Freq: Once | INTRAVENOUS | Status: AC
  Administered 2021-08-15: 20 mg via INTRAVENOUS

## 2021-08-15 MED ORDER — FAMOTIDINE IN NACL 20-0.9 MG/50ML-% IV SOLN
INTRAVENOUS | Status: AC
  Filled 2021-08-15: qty 50

## 2021-08-15 MED ORDER — PALONOSETRON HCL INJECTION 0.25 MG/5ML
INTRAVENOUS | Status: AC
  Administered 2021-08-15: 0.25 mg
  Filled 2021-08-15: qty 5

## 2021-08-15 MED ORDER — SODIUM CHLORIDE 0.9 % IV SOLN: Freq: Once | INTRAVENOUS | Status: AC

## 2021-08-15 MED ORDER — SODIUM CHLORIDE 0.9 % IV SOLN
10.0000 mg | Freq: Once | INTRAVENOUS | Status: AC
  Administered 2021-08-15: 10 mg via INTRAVENOUS
  Filled 2021-08-15: qty 10

## 2021-08-15 MED ORDER — SODIUM CHLORIDE 0.9% FLUSH
10.0000 mL | INTRAVENOUS | Status: DC | PRN
  Administered 2021-08-15: 10 mL

## 2021-08-15 MED ORDER — DIPHENHYDRAMINE HCL 50 MG/ML IJ SOLN
50.0000 mg | Freq: Once | INTRAMUSCULAR | Status: AC
  Administered 2021-08-15: 50 mg via INTRAVENOUS

## 2021-08-15 MED ORDER — SODIUM CHLORIDE 0.9 % IV SOLN
80.0000 mg/m2 | Freq: Once | INTRAVENOUS | Status: AC
  Administered 2021-08-15: 150 mg via INTRAVENOUS
  Filled 2021-08-15: qty 25

## 2021-08-15 MED ORDER — DIPHENHYDRAMINE HCL 50 MG/ML IJ SOLN
INTRAMUSCULAR | Status: AC
  Filled 2021-08-15: qty 1

## 2021-08-15 MED ORDER — HEPARIN SOD (PORK) LOCK FLUSH 100 UNIT/ML IV SOLN
500.0000 [IU] | Freq: Once | INTRAVENOUS | Status: AC | PRN
  Administered 2021-08-15: 500 [IU]

## 2021-08-15 NOTE — Patient Instructions (Signed)
[facility]  Discharge Instructions: Thank you for choosing [facility] to provide your oncology and hematology care.  If you have a lab appointment with the [facility], please come in thru the Main Entrance and check in at the main information desk.  Wear comfortable clothing and clothing appropriate for easy access to any Portacath or PICC line.   We strive to give you quality time with your provider. You may need to reschedule your appointment if you arrive late (15 or more minutes).  Arriving late affects you and other patients whose appointments are after yours.  Also, if you miss three or more appointments without notifying the office, you may be dismissed from the clinic at the provider's discretion.      For prescription refill requests, have your pharmacy contact our office and allow 72 hours for refills to be completed.    Today you received the following chemotherapy and/or immunotherapy agents taxol      To help prevent nausea and vomiting after your treatment, we encourage you to take your nausea medication as directed.  BELOW ARE SYMPTOMS THAT SHOULD BE REPORTED IMMEDIATELY: *FEVER GREATER THAN 100.4 F (38 C) OR HIGHER *CHILLS OR SWEATING *NAUSEA AND VOMITING THAT IS NOT CONTROLLED WITH YOUR NAUSEA MEDICATION *UNUSUAL SHORTNESS OF BREATH *UNUSUAL BRUISING OR BLEEDING *URINARY PROBLEMS (pain or burning when urinating, or frequent urination) *BOWEL PROBLEMS (unusual diarrhea, constipation, pain near the anus) TENDERNESS IN MOUTH AND THROAT WITH OR WITHOUT PRESENCE OF ULCERS (sore throat, sores in mouth, or a toothache) UNUSUAL RASH, SWELLING OR PAIN  UNUSUAL VAGINAL DISCHARGE OR ITCHING   Items with * indicate a potential emergency and should be followed up as soon as possible or go to the Emergency Department if any problems should occur.  Please show the CHEMOTHERAPY ALERT CARD or IMMUNOTHERAPY ALERT CARD at check-in to the Emergency  Department and triage nurse.  Should you have questions after your visit or need to cancel or reschedule your appointment, please contact [facility] [phone]  and follow the prompts.  Office hours are 8:00 a.m. to 4:30 p.m. Monday - Friday. Please note that voicemails left after 4:00 p.m. may not be returned until the following business day.  We are closed weekends and major holidays. You have access to a nurse at all times for urgent questions. Please call the main number to the clinic [phone] and follow the prompts.  For any non-urgent questions, you may also contact your provider using MyChart. We now offer e-Visits for anyone 70 and older to request care online for non-urgent symptoms. For details visit [email or web address].   Also download the MyChart app! Go to the app store, search "MyChart", open the app, select [facility], and log in with your MyChart username and password.  Masks are optional in the cancer centers. If you would like for your care team to wear a mask while they are taking care of you, please let them know. For doctor visits, patients may have with them one support person who is at least 48 years old. At this time, visitors are not allowed in the infusion area.

## 2021-08-15 NOTE — Progress Notes (Signed)
Patients port flushed without difficulty.  Good blood return noted with no bruising or swelling noted at site.  Stable during access and blood draw.  Patient to remain accessed for treatment. 

## 2021-08-15 NOTE — Progress Notes (Signed)
Labs meet parameters today for treatment. No new issues reported by patient today  HR 101, ok to proceed per MD.  Treatment given per orders. Patient tolerated it well without problems. Vitals stable and discharged home from clinic ambulatory. Follow up as scheduled.

## 2021-08-22 ENCOUNTER — Inpatient Hospital Stay: Payer: BC Managed Care – PPO

## 2021-08-22 ENCOUNTER — Inpatient Hospital Stay (HOSPITAL_BASED_OUTPATIENT_CLINIC_OR_DEPARTMENT_OTHER): Payer: BC Managed Care – PPO | Admitting: Hematology

## 2021-08-22 VITALS — BP 117/73 | HR 100 | Temp 98.3°F | Resp 18

## 2021-08-22 DIAGNOSIS — C50912 Malignant neoplasm of unspecified site of left female breast: Secondary | ICD-10-CM

## 2021-08-22 DIAGNOSIS — Z5111 Encounter for antineoplastic chemotherapy: Secondary | ICD-10-CM | POA: Diagnosis not present

## 2021-08-22 LAB — CBC WITH DIFFERENTIAL/PLATELET
Abs Immature Granulocytes: 0.04 10*3/uL (ref 0.00–0.07)
Basophils Absolute: 0 10*3/uL (ref 0.0–0.1)
Basophils Relative: 1 %
Eosinophils Absolute: 0.1 10*3/uL (ref 0.0–0.5)
Eosinophils Relative: 2 %
HCT: 31.5 % — ABNORMAL LOW (ref 36.0–46.0)
Hemoglobin: 10.8 g/dL — ABNORMAL LOW (ref 12.0–15.0)
Immature Granulocytes: 1 %
Lymphocytes Relative: 12 %
Lymphs Abs: 0.5 10*3/uL — ABNORMAL LOW (ref 0.7–4.0)
MCH: 32.3 pg (ref 26.0–34.0)
MCHC: 34.3 g/dL (ref 30.0–36.0)
MCV: 94.3 fL (ref 80.0–100.0)
Monocytes Absolute: 0.4 10*3/uL (ref 0.1–1.0)
Monocytes Relative: 9 %
Neutro Abs: 3.2 10*3/uL (ref 1.7–7.7)
Neutrophils Relative %: 75 %
Platelets: 266 10*3/uL (ref 150–400)
RBC: 3.34 MIL/uL — ABNORMAL LOW (ref 3.87–5.11)
RDW: 12.4 % (ref 11.5–15.5)
WBC: 4.2 10*3/uL (ref 4.0–10.5)
nRBC: 0 % (ref 0.0–0.2)

## 2021-08-22 LAB — COMPREHENSIVE METABOLIC PANEL
ALT: 74 U/L — ABNORMAL HIGH (ref 0–44)
AST: 53 U/L — ABNORMAL HIGH (ref 15–41)
Albumin: 3.6 g/dL (ref 3.5–5.0)
Alkaline Phosphatase: 58 U/L (ref 38–126)
Anion gap: 4 — ABNORMAL LOW (ref 5–15)
BUN: 11 mg/dL (ref 6–20)
CO2: 25 mmol/L (ref 22–32)
Calcium: 9.2 mg/dL (ref 8.9–10.3)
Chloride: 110 mmol/L (ref 98–111)
Creatinine, Ser: 0.64 mg/dL (ref 0.44–1.00)
GFR, Estimated: >60 mL/min (ref >60)
Glucose, Bld: 85 mg/dL (ref 70–99)
Potassium: 3.7 mmol/L (ref 3.5–5.1)
Sodium: 139 mmol/L (ref 135–145)
Total Bilirubin: 0.5 mg/dL (ref 0.3–1.2)
Total Protein: 6.6 g/dL (ref 6.5–8.1)

## 2021-08-22 LAB — MAGNESIUM: Magnesium: 1.9 mg/dL (ref 1.7–2.4)

## 2021-08-22 MED ORDER — SODIUM CHLORIDE 0.9 % IV SOLN: Freq: Once | INTRAVENOUS | Status: AC

## 2021-08-22 MED ORDER — FAMOTIDINE IN NACL 20-0.9 MG/50ML-% IV SOLN
20.0000 mg | Freq: Once | INTRAVENOUS | Status: AC
  Administered 2021-08-22: 20 mg via INTRAVENOUS

## 2021-08-22 MED ORDER — SODIUM CHLORIDE 0.9 % IV SOLN
80.0000 mg/m2 | Freq: Once | INTRAVENOUS | Status: AC
  Administered 2021-08-22: 150 mg via INTRAVENOUS
  Filled 2021-08-22: qty 25

## 2021-08-22 MED ORDER — SODIUM CHLORIDE 0.9% FLUSH
10.0000 mL | INTRAVENOUS | Status: DC | PRN
  Administered 2021-08-22: 10 mL

## 2021-08-22 MED ORDER — SODIUM CHLORIDE 0.9 % IV SOLN
10.0000 mg | Freq: Once | INTRAVENOUS | Status: AC
  Administered 2021-08-22: 10 mg via INTRAVENOUS
  Filled 2021-08-22: qty 10

## 2021-08-22 MED ORDER — DIPHENHYDRAMINE HCL 50 MG/ML IJ SOLN
INTRAMUSCULAR | Status: AC
  Filled 2021-08-22: qty 1

## 2021-08-22 MED ORDER — PALONOSETRON HCL INJECTION 0.25 MG/5ML
INTRAVENOUS | Status: AC
  Filled 2021-08-22: qty 5

## 2021-08-22 MED ORDER — FAMOTIDINE IN NACL 20-0.9 MG/50ML-% IV SOLN
INTRAVENOUS | Status: AC
  Filled 2021-08-22: qty 50

## 2021-08-22 MED ORDER — DIPHENHYDRAMINE HCL 50 MG/ML IJ SOLN
50.0000 mg | Freq: Once | INTRAMUSCULAR | Status: AC
  Administered 2021-08-22: 50 mg via INTRAVENOUS

## 2021-08-22 MED ORDER — HEPARIN SOD (PORK) LOCK FLUSH 100 UNIT/ML IV SOLN
500.0000 [IU] | Freq: Once | INTRAVENOUS | Status: AC | PRN
  Administered 2021-08-22: 500 [IU]

## 2021-08-22 NOTE — Progress Notes (Signed)
Patient presents today for chemotherapy infusion. Patient is in satisfactory condition with no complaints voiced.  Vital signs are stable.  Labs reviewed by Dr. Delton Coombes during her office visit and all labs are within treatment parameters.  We will proceed with treatment per MD orders.    Patient tolerated treatment well with no complaints voiced.  Patient left ambulatory in stable condition.  Vital signs stable at discharge.  Follow up as scheduled.

## 2021-08-22 NOTE — Patient Instructions (Addendum)
[facility] at [facility] Discharge Instructions   You were seen and examined today by Dr. Katragadda.  He reviewed the results of your lab work which are normal/stable.   We will proceed with your treatment today.  Return as scheduled.    Thank you for choosing [facility] at [facility] to provide your oncology and hematology care.  To afford each patient quality time with our provider, please arrive at least 15 minutes before your scheduled appointment time.   If you have a lab appointment with the Cancer Center please come in thru the Main Entrance and check in at the main information desk.  You need to re-schedule your appointment should you arrive 10 or more minutes late.  We strive to give you quality time with our providers, and arriving late affects you and other patients whose appointments are after yours.  Also, if you no show three or more times for appointments you may be dismissed from the clinic at the providers discretion.     Again, thank you for choosing [facility].  Our hope is that these requests will decrease the amount of time that you wait before being seen by our physicians.       _____________________________________________________________  Should you have questions after your visit to [facility], please contact our office at [phone] and follow the prompts.  Our office hours are 8:00 a.m. and 4:30 p.m. Monday - Friday.  Please note that voicemails left after 4:00 p.m. may not be returned until the following business day.  We are closed weekends and major holidays.  You do have access to a nurse 24-7, just call the main number to the clinic [phone] and do not press any options, hold on the line and a nurse will answer the phone.    For prescription refill requests, have your pharmacy contact our office and allow 72 hours.    Due to Covid, you will need to wear a mask upon entering  the hospital. If you do not have a mask, a mask will be given to you at the Main Entrance upon arrival. For doctor visits, patients may have 1 support person age 18 or older with them. For treatment visits, patients can not have anyone with them due to social distancing guidelines and our immunocompromised population.      

## 2021-08-22 NOTE — Patient Instructions (Signed)
[facility]  Discharge Instructions: Thank you for choosing [facility] to provide your oncology and hematology care.  If you have a lab appointment with the Cancer Center, please come in thru the Main Entrance and check in at the main information desk.  Wear comfortable clothing and clothing appropriate for easy access to any Portacath or PICC line.   We strive to give you quality time with your provider. You may need to reschedule your appointment if you arrive late (15 or more minutes).  Arriving late affects you and other patients whose appointments are after yours.  Also, if you miss three or more appointments without notifying the office, you may be dismissed from the clinic at the provider's discretion.      For prescription refill requests, have your pharmacy contact our office and allow 72 hours for refills to be completed.    Today you received the following chemotherapy and/or immunotherapy agents Taxol.  Paclitaxel Injection What is this medication? PACLITAXEL (PAK li TAX el) treats some types of cancer. It works by slowing down the growth of cancer cells. This medicine may be used for other purposes; ask your health care provider or pharmacist if you have questions. COMMON BRAND NAME(S): Onxol, Taxol What should I tell my care team before I take this medication? They need to know if you have any of these conditions: Heart disease Liver disease Low white blood cell levels An unusual or allergic reaction to paclitaxel, other medications, foods, dyes, or preservatives If you or your partner are pregnant or trying to get pregnant Breast-feeding How should I use this medication? This medication is injected into a vein. It is given by your care team in a hospital or clinic setting. Talk to your care team about the use of this medication in children. While it may be given to children for selected conditions, precautions do apply. Overdosage: If  you think you have taken too much of this medicine contact a poison control center or emergency room at once. NOTE: This medicine is only for you. Do not share this medicine with others. What if I miss a dose? Keep appointments for follow-up doses. It is important not to miss your dose. Call your care team if you are unable to keep an appointment. What may interact with this medication? Do not take this medication with any of the following: Live virus vaccines Other medications may affect the way this medication works. Talk with your care team about all of the medications you take. They may suggest changes to your treatment plan to lower the risk of side effects and to make sure your medications work as intended. This list may not describe all possible interactions. Give your health care provider a list of all the medicines, herbs, non-prescription drugs, or dietary supplements you use. Also tell them if you smoke, drink alcohol, or use illegal drugs. Some items may interact with your medicine. What should I watch for while using this medication? Your condition will be monitored carefully while you are receiving this medication. You may need blood work while taking this medication. This medication may make you feel generally unwell. This is not uncommon as chemotherapy can affect healthy cells as well as cancer cells. Report any side effects. Continue your course of treatment even though you feel ill unless your care team tells you to stop. This medication can cause serious allergic reactions. To reduce the risk, your care team may give you other medications to   take before receiving this one. Be sure to follow the directions from your care team. This medication may increase your risk of getting an infection. Call your care team for advice if you get a fever, chills, sore throat, or other symptoms of a cold or flu. Do not treat yourself. Try to avoid being around people who are sick. This medication may  increase your risk to bruise or bleed. Call your care team if you notice any unusual bleeding. Be careful brushing or flossing your teeth or using a toothpick because you may get an infection or bleed more easily. If you have any dental work done, tell your dentist you are receiving this medication. Talk to your care team if you may be pregnant. Serious birth defects can occur if you take this medication during pregnancy. Talk to your care team before breastfeeding. Changes to your treatment plan may be needed. What side effects may I notice from receiving this medication? Side effects that you should report to your care team as soon as possible: Allergic reactions--skin rash, itching, hives, swelling of the face, lips, tongue, or throat Heart rhythm changes--fast or irregular heartbeat, dizziness, feeling faint or lightheaded, chest pain, trouble breathing Increase in blood pressure Infection--fever, chills, cough, sore throat, wounds that don't heal, pain or trouble when passing urine, general feeling of discomfort or being unwell Low blood pressure--dizziness, feeling faint or lightheaded, blurry vision Low red blood cell level--unusual weakness or fatigue, dizziness, headache, trouble breathing Painful swelling, warmth, or redness of the skin, blisters or sores at the infusion site Pain, tingling, or numbness in the hands or feet Slow heartbeat--dizziness, feeling faint or lightheaded, confusion, trouble breathing, unusual weakness or fatigue Unusual bruising or bleeding Side effects that usually do not require medical attention (report to your care team if they continue or are bothersome): Diarrhea Hair loss Joint pain Loss of appetite Muscle pain Nausea Vomiting This list may not describe all possible side effects. Call your doctor for medical advice about side effects. You may report side effects to FDA at 1-800-FDA-1088. Where should I keep my medication? This medication is given in  a hospital or clinic. It will not be stored at home. NOTE: This sheet is a summary. It may not cover all possible information. If you have questions about this medicine, talk to your doctor, pharmacist, or health care provider.  2023 Elsevier/Gold Standard (2021-05-10 00:00:00)        To help prevent nausea and vomiting after your treatment, we encourage you to take your nausea medication as directed.  BELOW ARE SYMPTOMS THAT SHOULD BE REPORTED IMMEDIATELY: *FEVER GREATER THAN 100.4 F (38 C) OR HIGHER *CHILLS OR SWEATING *NAUSEA AND VOMITING THAT IS NOT CONTROLLED WITH YOUR NAUSEA MEDICATION *UNUSUAL SHORTNESS OF BREATH *UNUSUAL BRUISING OR BLEEDING *URINARY PROBLEMS (pain or burning when urinating, or frequent urination) *BOWEL PROBLEMS (unusual diarrhea, constipation, pain near the anus) TENDERNESS IN MOUTH AND THROAT WITH OR WITHOUT PRESENCE OF ULCERS (sore throat, sores in mouth, or a toothache) UNUSUAL RASH, SWELLING OR PAIN  UNUSUAL VAGINAL DISCHARGE OR ITCHING   Items with * indicate a potential emergency and should be followed up as soon as possible or go to the Emergency Department if any problems should occur.  Please show the CHEMOTHERAPY ALERT CARD or IMMUNOTHERAPY ALERT CARD at check-in to the Emergency Department and triage nurse.  Should you have questions after your visit or need to cancel or reschedule your appointment, please contact [facility] [phone]  and follow the prompts.  Office hours are 8:00 a.m. to 4:30 p.m. Monday - Friday. Please note that voicemails left after 4:00 p.m. may not be returned until the following business day.  We are closed weekends and major holidays. You have access to a nurse at all times for urgent questions. Please call the main number to the clinic [phone] and follow the prompts.  For any non-urgent questions, you may also contact your provider using MyChart. We now offer e-Visits for anyone 60 and older to  request care online for non-urgent symptoms. For details visit [email or web address].   Also download the MyChart app! Go to the app store, search "MyChart", open the app, select [facility], and log in with your MyChart username and password.  Masks are optional in the cancer centers. If you would like for your care team to wear a mask while they are taking care of you, please let them know. You may have one support person who is at least 48 years old accompany you for your appointments.

## 2021-08-22 NOTE — Progress Notes (Signed)
Ok to proceed with elevated HR.  T.O. Dr Rhys Martini, PharmD

## 2021-08-22 NOTE — Progress Notes (Signed)
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 Matt Holmes  NGS Results: not done  CURRENT THERAPY: ADJUVANT DOSE DENSE AC q14d / PACLitaxel q7d  BRIEF ONCOLOGIC HISTORY:  Oncology History  Invasive lobular carcinoma of left breast in female [facility])  04/11/2021 Initial Diagnosis   Invasive lobular carcinoma of left breast in female [facility])   05/01/2021 -  Chemotherapy   Patient is on Treatment Plan : BREAST ADJUVANT DOSE DENSE AC q14d / PACLitaxel q7d       CANCER STAGING:  Cancer Staging  Invasive lobular carcinoma of left breast in female [facility]) Staging form: Breast, AJCC 8th Edition - Clinical stage from 04/11/2021: Stage IIA (cT2, cN1, cM0, G2, ER+, PR+, HER2-) - Unsigned   INTERVAL HISTORY:  Debra Vaughn, a 46 y.o. female, seen for follow-up of chemotherapy with weekly paclitaxel for breast cancer.  She reports that she is having difficulty falling asleep especially during the night of treatment.  She also reported some sensitivity in the fingertips but denied any numbness or tingling.   REVIEW OF SYSTEMS:  Review of Systems  Constitutional:  Negative for appetite change and fatigue.  Cardiovascular:  Negative for leg swelling.  Gastrointestinal:  Negative for constipation, diarrhea, nausea and vomiting.  Musculoskeletal:  Negative for arthralgias.  Skin:  Negative for rash.  Neurological:  Negative for numbness.  Psychiatric/Behavioral:  Positive for sleep disturbance.   All other systems reviewed and are negative.   PAST MEDICAL/SURGICAL HISTORY:  No past medical history on file. Past Surgical History:  Procedure Laterality Date   BREAST BIOPSY Right 04/06/2021     SOCIAL HISTORY:  Social History   Socioeconomic History   Marital status: Married    Spouse name: Not on file   Number of children: Not on file   Years of education: Not on file   Highest education level: Not on file  Occupational History   Not on file  Tobacco Use   Smoking status: Never   Smokeless tobacco: Never  Vaping Use   Vaping Use: Never used  Substance and Sexual Activity   Alcohol use: Not Currently    Comment: occasionally   Drug use: Never   Sexual activity: Not Currently  Other Topics Concern   Not on file  Social History Narrative   Not on file   Social Determinants of Health   Financial Resource Strain: Not on file  Food Insecurity: Not on file  Transportation Needs: Not on file  Physical Activity: Not on file  Stress: Not on file  Social Connections: Not on file  Intimate Partner Violence: Not on file    FAMILY HISTORY:  No family history on file.  CURRENT MEDICATIONS:  Current Outpatient Medications  Medication Sig Dispense Refill   busPIRone (BUSPAR) 7.5 MG tablet Take 7.5 mg by mouth 3 (three) times daily.     FLUoxetine (PROZAC) 20 MG capsule Take 60 mg by mouth every morning.     Investigational dexamethasone 4 MG tablet [facility] 16070 Blister Card 2 Take 2 tablets by mouth daily. Take in the morning on Days 2-4. 6 tablet 0   Investigational olanzapine/placebo 10 MG capsule [facility] 16070 Blister Card 2 Take 1  capsule by mouth daily. Take in the morning on Days 2-4. 3 capsule 0   Investigational prochlorperazine maleate/placebo 10 MG capsule [facility] 16070 Blister Card 2 Take 1 capsule by mouth every 8 (eight) hours. Take on Days 1-4. 11 capsule 0   sucralfate (CARAFATE) 1 GM/10ML suspension Take 10 mLs (1 g total) by mouth 4 (four) times daily. Swish and swallow. 360 mL 2   lidocaine-prilocaine (EMLA) cream Apply a small amount to port a cath site and cover with plastic wrap 1 hour prior to infusion appointments (Patient not taking: Reported on  08/22/2021) 30 g 3   prochlorperazine (COMPAZINE) 10 MG tablet Take 1 tablet (10 mg total) by mouth every 6 (six) hours as needed (Nausea or vomiting). (Patient not taking: Reported on 08/22/2021) 60 tablet 3   No current facility-administered medications for this visit.   Facility-Administered Medications Ordered in Other Visits  Medication Dose Route Frequency Provider Last Rate Last Admin   diphenhydrAMINE (BENADRYL) 50 MG/ML injection            diphenhydrAMINE (BENADRYL) 50 MG/ML injection            famotidine (PEPCID) 20-0.9 MG/50ML-% IVPB            famotidine (PEPCID) 20-0.9 MG/50ML-% IVPB            octreotide (SANDOSTATIN LAR) 30 MG IM injection            palonosetron (ALOXI) 0.25 MG/5ML injection            sodium chloride flush (NS) 0.9 % injection 10 mL  10 mL Intravenous PRN Derek Jack, MD   10 mL at 05/08/21 1235   sodium chloride flush (NS) 0.9 % injection 10 mL  10 mL Intracatheter PRN Derek Jack, MD   10 mL at 08/22/21 1406    ALLERGIES:  Allergies  Allergen Reactions   Bupropion Hives    Other reaction(s): seizure    PHYSICAL EXAM:  Performance status (ECOG): 0 - Asymptomatic  There were no vitals filed for this visit. Wt Readings from Last 3 Encounters:  08/22/21 162 lb 9.6 oz (73.8 kg)  08/15/21 161 lb 12.8 oz (73.4 kg)  08/08/21 157 lb (71.2 kg)   Physical Exam Vitals reviewed.  Constitutional:      Appearance: Normal appearance.  Cardiovascular:     Rate and Rhythm: Normal rate and regular rhythm.     Pulses: Normal pulses.     Heart sounds: Normal heart sounds.  Pulmonary:     Effort: Pulmonary effort is normal.     Breath sounds: Normal breath sounds.  Musculoskeletal:     Right lower leg: No edema.     Left lower leg: No edema.  Neurological:     General: No focal deficit present.     Mental Status: She is alert and oriented to person, place, and time.  Psychiatric:        Mood and Affect: Mood normal.        Behavior:  Behavior normal.     LABORATORY DATA:  I have reviewed the labs as listed.     Latest Ref Rng & Units 08/22/2021    9:47 AM 08/15/2021    9:20 AM 08/08/2021    8:16 AM  CBC  WBC 4.0 - 10.5 K/uL 4.2  4.3  3.4   Hemoglobin 12.0 - 15.0 g/dL 10.8  10.7  10.1   Hematocrit 36.0 - 46.0 % 31.5  31.5  29.5   Platelets  150 - 400 K/uL 266  287  270       Latest Ref Rng & Units 08/22/2021    9:47 AM 08/15/2021    9:20 AM 08/08/2021    8:16 AM  CMP  Glucose 70 - 99 mg/dL 85  140  96   BUN 6 - 20 mg/dL _0 Creatinine 0.44 - 1.00 mg/dL 0.64  0.65  0.62   Sodium 135 - 145 mmol/L 139  138  138   Potassium 3.5 - 5.1 mmol/L 3.7  3.7  3.5   Chloride 98 - 111 mmol/L 110  108  109   CO2 22 - 32 mmol/L _1 Calcium 8.9 - 10.3 mg/dL 9.2  9.0  8.9   Total Protein 6.5 - 8.1 g/dL 6.6  6.8  6.6   Total Bilirubin 0.3 - 1.2 mg/dL 0.5  0.6  0.6   Alkaline Phos 38 - 126 U/L 58  64  57   AST 15 - 41 U/L 53  43  65   ALT 0 - 44 U/L 74  68  86     DIAGNOSTIC IMAGING:  I have independently reviewed the scans and discussed with the patient. No results found.   ASSESSMENT:  Stage II T2N1 left breast invasive lobular carcinoma, ER/PR positive and HER2 negative: - She felt the lump in her left breast before Thanksgiving in 2022.  She missed mammograms for few years prior to that. - Mammogram was done in [facility]. - Left breast biopsy on 01/10/2021: Invasive lobular carcinoma, intermediate grade, ER/PR 90% positive, Ki-67 5 to 10% positive, HER2 negative - Left breast lumpectomy and SLNB on 02/01/2021 by Dr. Matt Holmes - Pathology: Multifocal infiltrating lobular carcinoma, largest focus measuring 3.5 cm, multiple additional nodules present in the specimen particularly in the medial and lateral margins, grade 2, invasive tumor at the inked medial margin, 2/2 positive axillary lymph nodes, Ki-67 variable (40-50% with a number of tumor nodules stain and examined), ER/PR positive, HER2 negative.   Additional anterior medial margin was negative.  No tumor seen at the left breast deep margin tissue. - Patient reportedly had genetic testing done and was told BRCA -7 years ago in [place]. - MRI of the breast on 03/30/2021: Mildly prominent left subpectoral lymph node measuring 8 mm.  Solitary prominent left axillary lymph node.  5 mm mass in the upper inner quadrant of the right breast. - Right breast biopsy: Pseudoangiomatous stromal hyperplasia, blunt duct adenosis, no malignancy identified.  Left breast intramammary lymph node shows nodal tissue with pigmented histiocytes.  No malignancy. - Oncotype DX: RS-[id number].  Distal recurrence risk at 9 years on tamoxifen was 21%. - PET scan on 04/20/2021: Postsurgical changes in both breasts without metabolic activity for residual tumor.  Small focus of faint hypermetabolic activity within the left anterior chest wall involving the pectoralis muscle or subpectoral space without corresponding CT finding.  No evidence of distant metastatic disease. - Dose dense AC for 4 cycles from 05/01/2021 through 06/12/2021, weekly paclitaxel started on 06/27/2021, ongoing   Social/family history: - She lives in [place] with her husband.  She works as a Licensed conveyancer.  She is a non-smoker. - Mother had breast cancer at age 79.  Paternal grandmother had thyroid cancer.   PLAN:  Stage II T2N1 invasive lobular carcinoma of left breast: - She is tolerating weekly paclitaxel reasonably well.  Mild sensitivity in the fingertips without any neuropathy.        -  Reviewed labs today which showed mildly elevated AST and ALT for the last 3 times, likely paclitaxel induced.  CBC was grossly normal.  Mild anemia stable. - Proceed with week 9 of paclitaxel today.  I will see her back prior to week 12. - I will make a referral to radiation oncology in [place]. - We also talked about initiating her on antiestrogen therapy.  She currently has no menses.  She had Mirena IUD removed.  We will  consider checking FSH and estradiol.  We discussed option of tamoxifen versus AI.  If she is in the postmenopausal range, will consider starting her on AI.  If she does have menses, will consider LHRH agonist and anastrozole.  2.  Anxiety/depression: - Continue Prozac 60 mg daily and BuSpar 3 times daily as needed.   Orders placed this encounter:  No orders of the defined types were placed in this encounter.    Derek Jack, MD [place] [phone]   I, Thana Ates, am acting as a scribe for Dr. Derek Jack.  I, Derek Jack MD, have reviewed the above documentation for accuracy and completeness, and I agree with the above.

## 2021-08-23 ENCOUNTER — Other Ambulatory Visit: Payer: Self-pay

## 2021-08-30 ENCOUNTER — Inpatient Hospital Stay: Payer: BC Managed Care – PPO

## 2021-08-30 VITALS — BP 118/78 | HR 96 | Temp 98.4°F | Resp 16

## 2021-08-30 DIAGNOSIS — C50912 Malignant neoplasm of unspecified site of left female breast: Secondary | ICD-10-CM

## 2021-08-30 DIAGNOSIS — Z5111 Encounter for antineoplastic chemotherapy: Secondary | ICD-10-CM | POA: Diagnosis not present

## 2021-08-30 LAB — COMPREHENSIVE METABOLIC PANEL
ALT: 57 U/L — ABNORMAL HIGH (ref 0–44)
AST: 31 U/L (ref 15–41)
Albumin: 3.5 g/dL (ref 3.5–5.0)
Alkaline Phosphatase: 60 U/L (ref 38–126)
Anion gap: 5 (ref 5–15)
BUN: 11 mg/dL (ref 6–20)
CO2: 25 mmol/L (ref 22–32)
Calcium: 8.9 mg/dL (ref 8.9–10.3)
Chloride: 109 mmol/L (ref 98–111)
Creatinine, Ser: 0.66 mg/dL (ref 0.44–1.00)
GFR, Estimated: >60 mL/min (ref >60)
Glucose, Bld: 87 mg/dL (ref 70–99)
Potassium: 3.9 mmol/L (ref 3.5–5.1)
Sodium: 139 mmol/L (ref 135–145)
Total Bilirubin: 0.5 mg/dL (ref 0.3–1.2)
Total Protein: 6.3 g/dL — ABNORMAL LOW (ref 6.5–8.1)

## 2021-08-30 LAB — CBC WITH DIFFERENTIAL/PLATELET
Abs Immature Granulocytes: 0.07 10*3/uL (ref 0.00–0.07)
Basophils Absolute: 0 10*3/uL (ref 0.0–0.1)
Basophils Relative: 1 %
Eosinophils Absolute: 0.1 10*3/uL (ref 0.0–0.5)
Eosinophils Relative: 1 %
HCT: 32.6 % — ABNORMAL LOW (ref 36.0–46.0)
Hemoglobin: 11 g/dL — ABNORMAL LOW (ref 12.0–15.0)
Immature Granulocytes: 1 %
Lymphocytes Relative: 14 %
Lymphs Abs: 0.7 10*3/uL (ref 0.7–4.0)
MCH: 32 pg (ref 26.0–34.0)
MCHC: 33.7 g/dL (ref 30.0–36.0)
MCV: 94.8 fL (ref 80.0–100.0)
Monocytes Absolute: 0.5 10*3/uL (ref 0.1–1.0)
Monocytes Relative: 11 %
Neutro Abs: 3.6 10*3/uL (ref 1.7–7.7)
Neutrophils Relative %: 72 %
Platelets: 289 10*3/uL (ref 150–400)
RBC: 3.44 MIL/uL — ABNORMAL LOW (ref 3.87–5.11)
RDW: 12.6 % (ref 11.5–15.5)
WBC: 5 10*3/uL (ref 4.0–10.5)
nRBC: 0 % (ref 0.0–0.2)

## 2021-08-30 LAB — MAGNESIUM: Magnesium: 1.9 mg/dL (ref 1.7–2.4)

## 2021-08-30 MED ORDER — SODIUM CHLORIDE 0.9% FLUSH
10.0000 mL | INTRAVENOUS | Status: DC | PRN
  Administered 2021-08-30: 10 mL

## 2021-08-30 MED ORDER — FAMOTIDINE IN NACL 20-0.9 MG/50ML-% IV SOLN
20.0000 mg | Freq: Once | INTRAVENOUS | Status: AC
  Administered 2021-08-30: 20 mg via INTRAVENOUS
  Filled 2021-08-30: qty 50

## 2021-08-30 MED ORDER — DIPHENHYDRAMINE HCL 50 MG/ML IJ SOLN
50.0000 mg | Freq: Once | INTRAMUSCULAR | Status: AC
  Administered 2021-08-30: 50 mg via INTRAVENOUS
  Filled 2021-08-30: qty 1

## 2021-08-30 MED ORDER — SODIUM CHLORIDE 0.9 % IV SOLN
10.0000 mg | Freq: Once | INTRAVENOUS | Status: AC
  Administered 2021-08-30: 10 mg via INTRAVENOUS
  Filled 2021-08-30: qty 10

## 2021-08-30 MED ORDER — SODIUM CHLORIDE 0.9 % IV SOLN: Freq: Once | INTRAVENOUS | Status: AC

## 2021-08-30 MED ORDER — SODIUM CHLORIDE 0.9 % IV SOLN
80.0000 mg/m2 | Freq: Once | INTRAVENOUS | Status: AC
  Administered 2021-08-30: 150 mg via INTRAVENOUS
  Filled 2021-08-30: qty 25

## 2021-08-30 MED ORDER — HEPARIN SOD (PORK) LOCK FLUSH 100 UNIT/ML IV SOLN
500.0000 [IU] | Freq: Once | INTRAVENOUS | Status: AC | PRN
  Administered 2021-08-30: 500 [IU]

## 2021-08-30 NOTE — Patient Instructions (Signed)
[facility]  Discharge Instructions: Thank you for choosing [facility] to provide your oncology and hematology care.  If you have a lab appointment with the Cancer Center, please come in thru the Main Entrance and check in at the main information desk.  Wear comfortable clothing and clothing appropriate for easy access to any Portacath or PICC line.   We strive to give you quality time with your provider. You may need to reschedule your appointment if you arrive late (15 or more minutes).  Arriving late affects you and other patients whose appointments are after yours.  Also, if you miss three or more appointments without notifying the office, you may be dismissed from the clinic at the provider's discretion.      For prescription refill requests, have your pharmacy contact our office and allow 72 hours for refills to be completed.    Today you received the following chemotherapy and/or immunotherapy agents Taxol, return as scheduled.   To help prevent nausea and vomiting after your treatment, we encourage you to take your nausea medication as directed.  BELOW ARE SYMPTOMS THAT SHOULD BE REPORTED IMMEDIATELY: *FEVER GREATER THAN 100.4 F (38 C) OR HIGHER *CHILLS OR SWEATING *NAUSEA AND VOMITING THAT IS NOT CONTROLLED WITH YOUR NAUSEA MEDICATION *UNUSUAL SHORTNESS OF BREATH *UNUSUAL BRUISING OR BLEEDING *URINARY PROBLEMS (pain or burning when urinating, or frequent urination) *BOWEL PROBLEMS (unusual diarrhea, constipation, pain near the anus) TENDERNESS IN MOUTH AND THROAT WITH OR WITHOUT PRESENCE OF ULCERS (sore throat, sores in mouth, or a toothache) UNUSUAL RASH, SWELLING OR PAIN  UNUSUAL VAGINAL DISCHARGE OR ITCHING   Items with * indicate a potential emergency and should be followed up as soon as possible or go to the Emergency Department if any problems should occur.  Please show the CHEMOTHERAPY ALERT CARD or IMMUNOTHERAPY ALERT CARD at check-in  to the Emergency Department and triage nurse.  Should you have questions after your visit or need to cancel or reschedule your appointment, please contact [facility] AT  [phone]  and follow the prompts.  Office hours are 8:00 a.m. to 4:30 p.m. Monday - Friday. Please note that voicemails left after 4:00 p.m. may not be returned until the following business day.  We are closed weekends and major holidays. You have access to a nurse at all times for urgent questions. Please call the main number to the clinic [phone] and follow the prompts.  For any non-urgent questions, you may also contact your provider using MyChart. We now offer e-Visits for anyone 18 and older to request care online for non-urgent symptoms. For details visit [email or web address].   Also download the MyChart app! Go to the app store, search "MyChart", open the app, select [facility], and log in with your MyChart username and password.  Masks are optional in the cancer centers. If you would like for your care team to wear a mask while they are taking care of you, please let them know. You may have one support person who is at least 48 years old accompany you for your appointments.  

## 2021-08-30 NOTE — Progress Notes (Signed)
Patient presents today for treatment, labs and vitals within treatment parameters.  Patient tolerated chemotherapy with no complaints voiced. Side effects with management reviewed understanding verbalized. Port site clean and dry with no bruising or swelling noted at site. Good blood return noted before and after administration of chemotherapy. Band aid applied. Patient left in satisfactory condition with VSS and no s/s of distress noted.

## 2021-09-05 ENCOUNTER — Inpatient Hospital Stay: Payer: BC Managed Care – PPO

## 2021-09-05 VITALS — BP 135/81 | HR 108 | Temp 98.8°F | Resp 18

## 2021-09-05 DIAGNOSIS — C50912 Malignant neoplasm of unspecified site of left female breast: Secondary | ICD-10-CM

## 2021-09-05 DIAGNOSIS — Z5111 Encounter for antineoplastic chemotherapy: Secondary | ICD-10-CM | POA: Diagnosis not present

## 2021-09-05 LAB — CBC WITH DIFFERENTIAL/PLATELET
Abs Immature Granulocytes: 0.04 10*3/uL (ref 0.00–0.07)
Basophils Absolute: 0.1 10*3/uL (ref 0.0–0.1)
Basophils Relative: 1 %
Eosinophils Absolute: 0.1 10*3/uL (ref 0.0–0.5)
Eosinophils Relative: 2 %
HCT: 34.2 % — ABNORMAL LOW (ref 36.0–46.0)
Hemoglobin: 11.7 g/dL — ABNORMAL LOW (ref 12.0–15.0)
Immature Granulocytes: 1 %
Lymphocytes Relative: 15 %
Lymphs Abs: 0.8 10*3/uL (ref 0.7–4.0)
MCH: 31.8 pg (ref 26.0–34.0)
MCHC: 34.2 g/dL (ref 30.0–36.0)
MCV: 92.9 fL (ref 80.0–100.0)
Monocytes Absolute: 0.3 10*3/uL (ref 0.1–1.0)
Monocytes Relative: 7 %
Neutro Abs: 3.8 10*3/uL (ref 1.7–7.7)
Neutrophils Relative %: 74 %
Platelets: 297 10*3/uL (ref 150–400)
RBC: 3.68 MIL/uL — ABNORMAL LOW (ref 3.87–5.11)
RDW: 12.3 % (ref 11.5–15.5)
WBC: 5.1 10*3/uL (ref 4.0–10.5)
nRBC: 0 % (ref 0.0–0.2)

## 2021-09-05 LAB — COMPREHENSIVE METABOLIC PANEL
ALT: 64 U/L — ABNORMAL HIGH (ref 0–44)
AST: 45 U/L — ABNORMAL HIGH (ref 15–41)
Albumin: 3.8 g/dL (ref 3.5–5.0)
Alkaline Phosphatase: 60 U/L (ref 38–126)
Anion gap: 8 (ref 5–15)
BUN: 11 mg/dL (ref 6–20)
CO2: 23 mmol/L (ref 22–32)
Calcium: 9.1 mg/dL (ref 8.9–10.3)
Chloride: 107 mmol/L (ref 98–111)
Creatinine, Ser: 0.65 mg/dL (ref 0.44–1.00)
GFR, Estimated: >60 mL/min (ref >60)
Glucose, Bld: 96 mg/dL (ref 70–99)
Potassium: 3.7 mmol/L (ref 3.5–5.1)
Sodium: 138 mmol/L (ref 135–145)
Total Bilirubin: 0.8 mg/dL (ref 0.3–1.2)
Total Protein: 6.6 g/dL (ref 6.5–8.1)

## 2021-09-05 LAB — MAGNESIUM: Magnesium: 1.9 mg/dL (ref 1.7–2.4)

## 2021-09-05 MED ORDER — SODIUM CHLORIDE 0.9% FLUSH
10.0000 mL | INTRAVENOUS | Status: DC | PRN
  Administered 2021-09-05: 10 mL

## 2021-09-05 MED ORDER — SODIUM CHLORIDE 0.9 % IV SOLN
10.0000 mg | Freq: Once | INTRAVENOUS | Status: AC
  Administered 2021-09-05: 10 mg via INTRAVENOUS
  Filled 2021-09-05: qty 10

## 2021-09-05 MED ORDER — FAMOTIDINE IN NACL 20-0.9 MG/50ML-% IV SOLN
20.0000 mg | Freq: Once | INTRAVENOUS | Status: AC
  Administered 2021-09-05: 20 mg via INTRAVENOUS
  Filled 2021-09-05: qty 50

## 2021-09-05 MED ORDER — DIPHENHYDRAMINE HCL 50 MG/ML IJ SOLN
50.0000 mg | Freq: Once | INTRAMUSCULAR | Status: AC
  Administered 2021-09-05: 50 mg via INTRAVENOUS
  Filled 2021-09-05: qty 1

## 2021-09-05 MED ORDER — HEPARIN SOD (PORK) LOCK FLUSH 100 UNIT/ML IV SOLN
500.0000 [IU] | Freq: Once | INTRAVENOUS | Status: AC | PRN
  Administered 2021-09-05: 500 [IU]

## 2021-09-05 MED ORDER — SODIUM CHLORIDE 0.9 % IV SOLN: Freq: Once | INTRAVENOUS | Status: AC

## 2021-09-05 MED ORDER — SODIUM CHLORIDE 0.9 % IV SOLN
80.0000 mg/m2 | Freq: Once | INTRAVENOUS | Status: AC
  Administered 2021-09-05: 150 mg via INTRAVENOUS
  Filled 2021-09-05: qty 25

## 2021-09-05 NOTE — Patient Instructions (Signed)
[facility]  Discharge Instructions: Thank you for choosing [facility] to provide your oncology and hematology care.  If you have a lab appointment with the [facility], please come in thru the Main Entrance and check in at the main information desk.  Wear comfortable clothing and clothing appropriate for easy access to any Portacath or PICC line.   We strive to give you quality time with your provider. You may need to reschedule your appointment if you arrive late (15 or more minutes).  Arriving late affects you and other patients whose appointments are after yours.  Also, if you miss three or more appointments without notifying the office, you may be dismissed from the clinic at the provider's discretion.      For prescription refill requests, have your pharmacy contact our office and allow 72 hours for refills to be completed.    Today you received the following chemotherapy and/or immunotherapy agents Taxol.  Paclitaxel Injection What is this medication? PACLITAXEL (PAK li TAX el) treats some types of cancer. It works by slowing down the growth of cancer cells. This medicine may be used for other purposes; ask your health care provider or pharmacist if you have questions. COMMON BRAND NAME(S): Onxol, Taxol What should I tell my care team before I take this medication? They need to know if you have any of these conditions: Heart disease Liver disease Low white blood cell levels An unusual or allergic reaction to paclitaxel, other medications, foods, dyes, or preservatives If you or your partner are pregnant or trying to get pregnant Breast-feeding How should I use this medication? This medication is injected into a vein. It is given by your care team in a hospital or clinic setting. Talk to your care team about the use of this medication in children. While it may be given to children for selected conditions, precautions do apply. Overdosage: If you  think you have taken too much of this medicine contact a poison control center or emergency room at once. NOTE: This medicine is only for you. Do not share this medicine with others. What if I miss a dose? Keep appointments for follow-up doses. It is important not to miss your dose. Call your care team if you are unable to keep an appointment. What may interact with this medication? Do not take this medication with any of the following: Live virus vaccines Other medications may affect the way this medication works. Talk with your care team about all of the medications you take. They may suggest changes to your treatment plan to lower the risk of side effects and to make sure your medications work as intended. This list may not describe all possible interactions. Give your health care provider a list of all the medicines, herbs, non-prescription drugs, or dietary supplements you use. Also tell them if you smoke, drink alcohol, or use illegal drugs. Some items may interact with your medicine. What should I watch for while using this medication? Your condition will be monitored carefully while you are receiving this medication. You may need blood work while taking this medication. This medication may make you feel generally unwell. This is not uncommon as chemotherapy can affect healthy cells as well as cancer cells. Report any side effects. Continue your course of treatment even though you feel ill unless your care team tells you to stop. This medication can cause serious allergic reactions. To reduce the risk, your care team may give you other medications to  take before receiving this one. Be sure to follow the directions from your care team. This medication may increase your risk of getting an infection. Call your care team for advice if you get a fever, chills, sore throat, or other symptoms of a cold or flu. Do not treat yourself. Try to avoid being around people who are sick. This medication may  increase your risk to bruise or bleed. Call your care team if you notice any unusual bleeding. Be careful brushing or flossing your teeth or using a toothpick because you may get an infection or bleed more easily. If you have any dental work done, tell your dentist you are receiving this medication. Talk to your care team if you may be pregnant. Serious birth defects can occur if you take this medication during pregnancy. Talk to your care team before breastfeeding. Changes to your treatment plan may be needed. What side effects may I notice from receiving this medication? Side effects that you should report to your care team as soon as possible: Allergic reactions--skin rash, itching, hives, swelling of the face, lips, tongue, or throat Heart rhythm changes--fast or irregular heartbeat, dizziness, feeling faint or lightheaded, chest pain, trouble breathing Increase in blood pressure Infection--fever, chills, cough, sore throat, wounds that don't heal, pain or trouble when passing urine, general feeling of discomfort or being unwell Low blood pressure--dizziness, feeling faint or lightheaded, blurry vision Low red blood cell level--unusual weakness or fatigue, dizziness, headache, trouble breathing Painful swelling, warmth, or redness of the skin, blisters or sores at the infusion site Pain, tingling, or numbness in the hands or feet Slow heartbeat--dizziness, feeling faint or lightheaded, confusion, trouble breathing, unusual weakness or fatigue Unusual bruising or bleeding Side effects that usually do not require medical attention (report to your care team if they continue or are bothersome): Diarrhea Hair loss Joint pain Loss of appetite Muscle pain Nausea Vomiting This list may not describe all possible side effects. Call your doctor for medical advice about side effects. You may report side effects to FDA at 1-800-FDA-1088. Where should I keep my medication? This medication is given in  a hospital or clinic. It will not be stored at home. NOTE: This sheet is a summary. It may not cover all possible information. If you have questions about this medicine, talk to your doctor, pharmacist, or health care provider.  2023 Elsevier/Gold Standard (2021-05-10 00:00:00)       To help prevent nausea and vomiting after your treatment, we encourage you to take your nausea medication as directed.  BELOW ARE SYMPTOMS THAT SHOULD BE REPORTED IMMEDIATELY: *FEVER GREATER THAN 100.4 F (38 C) OR HIGHER *CHILLS OR SWEATING *NAUSEA AND VOMITING THAT IS NOT CONTROLLED WITH YOUR NAUSEA MEDICATION *UNUSUAL SHORTNESS OF BREATH *UNUSUAL BRUISING OR BLEEDING *URINARY PROBLEMS (pain or burning when urinating, or frequent urination) *BOWEL PROBLEMS (unusual diarrhea, constipation, pain near the anus) TENDERNESS IN MOUTH AND THROAT WITH OR WITHOUT PRESENCE OF ULCERS (sore throat, sores in mouth, or a toothache) UNUSUAL RASH, SWELLING OR PAIN  UNUSUAL VAGINAL DISCHARGE OR ITCHING   Items with * indicate a potential emergency and should be followed up as soon as possible or go to the Emergency Department if any problems should occur.  Please show the CHEMOTHERAPY ALERT CARD or IMMUNOTHERAPY ALERT CARD at check-in to the Emergency Department and triage nurse.  Should you have questions after your visit or need to cancel or reschedule your appointment, please contact [facility] [phone]  and  follow the prompts.  Office hours are 8:00 a.m. to 4:30 p.m. Monday - Friday. Please note that voicemails left after 4:00 p.m. may not be returned until the following business day.  We are closed weekends and major holidays. You have access to a nurse at all times for urgent questions. Please call the main number to the clinic [phone] and follow the prompts.  For any non-urgent questions, you may also contact your provider using MyChart. We now offer e-Visits for anyone 59 and older to  request care online for non-urgent symptoms. For details visit [email or web address].   Also download the MyChart app! Go to the app store, search "MyChart", open the app, select [facility], and log in with your MyChart username and password.  Masks are optional in the cancer centers. If you would like for your care team to wear a mask while they are taking care of you, please let them know. You may have one support person who is at least 48 years old accompany you for your appointments.

## 2021-09-05 NOTE — Progress Notes (Signed)
Patient presents today for treatment. Taxol. Labs pending. Heart rate on arrival 113. Patient denies pain today. MAR reviewed and updated. Patient has complaints of skin peeling near nail bed on fingers. Patient has complaints of slight sensitivity to fingers per patient's words. Heart rate recheck 97.   Treatment given today per MD orders. Tolerated infusion without adverse affects. Vital signs stable. No complaints at this time. Discharged from clinic ambulatory in stable condition. Alert and oriented x 3. F/U with [facility] as scheduled.

## 2021-09-07 ENCOUNTER — Other Ambulatory Visit: Payer: Self-pay

## 2021-09-11 ENCOUNTER — Other Ambulatory Visit: Payer: Self-pay

## 2021-09-12 ENCOUNTER — Inpatient Hospital Stay: Payer: BC Managed Care – PPO | Admitting: Hematology

## 2021-09-12 ENCOUNTER — Inpatient Hospital Stay: Payer: BC Managed Care – PPO | Attending: Hematology

## 2021-09-12 ENCOUNTER — Inpatient Hospital Stay: Payer: BC Managed Care – PPO

## 2021-09-12 VITALS — HR 117

## 2021-09-12 VITALS — BP 120/77 | HR 100 | Temp 98.6°F | Resp 18 | Wt 163.6 lb

## 2021-09-12 DIAGNOSIS — C50912 Malignant neoplasm of unspecified site of left female breast: Secondary | ICD-10-CM

## 2021-09-12 DIAGNOSIS — Z170 Estrogen receptor positive status [ER+]: Secondary | ICD-10-CM | POA: Insufficient documentation

## 2021-09-12 LAB — CBC WITH DIFFERENTIAL/PLATELET
Abs Immature Granulocytes: 0.13 10*3/uL — ABNORMAL HIGH (ref 0.00–0.07)
Basophils Absolute: 0.1 10*3/uL (ref 0.0–0.1)
Basophils Relative: 1 %
Eosinophils Absolute: 0.1 10*3/uL (ref 0.0–0.5)
Eosinophils Relative: 2 %
HCT: 33.3 % — ABNORMAL LOW (ref 36.0–46.0)
Hemoglobin: 11.2 g/dL — ABNORMAL LOW (ref 12.0–15.0)
Immature Granulocytes: 2 %
Lymphocytes Relative: 12 %
Lymphs Abs: 0.8 10*3/uL (ref 0.7–4.0)
MCH: 31.4 pg (ref 26.0–34.0)
MCHC: 33.6 g/dL (ref 30.0–36.0)
MCV: 93.3 fL (ref 80.0–100.0)
Monocytes Absolute: 0.7 10*3/uL (ref 0.1–1.0)
Monocytes Relative: 10 %
Neutro Abs: 4.7 10*3/uL (ref 1.7–7.7)
Neutrophils Relative %: 73 %
Platelets: 292 10*3/uL (ref 150–400)
RBC: 3.57 MIL/uL — ABNORMAL LOW (ref 3.87–5.11)
RDW: 12.6 % (ref 11.5–15.5)
WBC: 6.5 10*3/uL (ref 4.0–10.5)
nRBC: 0 % (ref 0.0–0.2)

## 2021-09-12 LAB — COMPREHENSIVE METABOLIC PANEL
ALT: 106 U/L — ABNORMAL HIGH (ref 0–44)
AST: 89 U/L — ABNORMAL HIGH (ref 15–41)
Albumin: 3.6 g/dL (ref 3.5–5.0)
Alkaline Phosphatase: 62 U/L (ref 38–126)
Anion gap: 9 (ref 5–15)
BUN: 11 mg/dL (ref 6–20)
CO2: 22 mmol/L (ref 22–32)
Calcium: 9 mg/dL (ref 8.9–10.3)
Chloride: 108 mmol/L (ref 98–111)
Creatinine, Ser: 0.69 mg/dL (ref 0.44–1.00)
GFR, Estimated: >60 mL/min (ref >60)
Glucose, Bld: 87 mg/dL (ref 70–99)
Potassium: 3.6 mmol/L (ref 3.5–5.1)
Sodium: 139 mmol/L (ref 135–145)
Total Bilirubin: 0.6 mg/dL (ref 0.3–1.2)
Total Protein: 6.4 g/dL — ABNORMAL LOW (ref 6.5–8.1)

## 2021-09-12 LAB — MAGNESIUM: Magnesium: 1.9 mg/dL (ref 1.7–2.4)

## 2021-09-12 MED ORDER — HEPARIN SOD (PORK) LOCK FLUSH 100 UNIT/ML IV SOLN
500.0000 [IU] | Freq: Once | INTRAVENOUS | Status: AC | PRN
  Administered 2021-09-12: 500 [IU]

## 2021-09-12 MED ORDER — DIPHENHYDRAMINE HCL 50 MG/ML IJ SOLN
50.0000 mg | Freq: Once | INTRAMUSCULAR | Status: AC
  Administered 2021-09-12: 50 mg via INTRAVENOUS
  Filled 2021-09-12: qty 1

## 2021-09-12 MED ORDER — SODIUM CHLORIDE 0.9% FLUSH
10.0000 mL | INTRAVENOUS | Status: DC | PRN
  Administered 2021-09-12: 10 mL

## 2021-09-12 MED ORDER — ANASTROZOLE 1 MG PO TABS: 1.0000 mg | ORAL_TABLET | Freq: Every day | ORAL | 3 refills | Status: DC

## 2021-09-12 MED ORDER — SODIUM CHLORIDE 0.9 % IV SOLN: Freq: Once | INTRAVENOUS | Status: AC

## 2021-09-12 MED ORDER — FAMOTIDINE IN NACL 20-0.9 MG/50ML-% IV SOLN
20.0000 mg | Freq: Once | INTRAVENOUS | Status: AC
  Administered 2021-09-12: 20 mg via INTRAVENOUS
  Filled 2021-09-12: qty 50

## 2021-09-12 MED ORDER — LANREOTIDE ACETATE 120 MG/0.5ML ~~LOC~~ SOLN
SUBCUTANEOUS | Status: AC
  Filled 2021-09-12: qty 120

## 2021-09-12 MED ORDER — SODIUM CHLORIDE 0.9 % IV SOLN
80.0000 mg/m2 | Freq: Once | INTRAVENOUS | Status: AC
  Administered 2021-09-12: 150 mg via INTRAVENOUS
  Filled 2021-09-12: qty 25

## 2021-09-12 MED ORDER — SODIUM CHLORIDE 0.9 % IV SOLN
10.0000 mg | Freq: Once | INTRAVENOUS | Status: AC
  Administered 2021-09-12: 10 mg via INTRAVENOUS
  Filled 2021-09-12: qty 10

## 2021-09-12 NOTE — Progress Notes (Signed)
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 Matt Holmes  NGS Results: not done  CURRENT THERAPY: ADJUVANT DOSE DENSE AC q14d / PACLitaxel q7d  BRIEF ONCOLOGIC HISTORY:  Oncology History  Invasive lobular carcinoma of left breast in female [facility])  04/11/2021 Initial Diagnosis   Invasive lobular carcinoma of left breast in female [facility])   05/01/2021 -  Chemotherapy   Patient is on Treatment Plan : BREAST ADJUVANT DOSE DENSE AC q14d / PACLitaxel q7d       CANCER STAGING:  Cancer Staging  Invasive lobular carcinoma of left breast in female [facility]) Staging form: Breast, AJCC 8th Edition - Clinical stage from 04/11/2021: Stage IIA (cT2, cN1, cM0, G2, ER+, PR+, HER2-) - Unsigned   INTERVAL HISTORY:  Ms. AILSA MIRELES, a 46 y.o. female, seen for her last cycle of weekly paclitaxel.  Reported sensitivity in the fingertips.  Energy levels are around 40%.  Denies any tingling or numbness in the extremities.   REVIEW OF SYSTEMS:  Review of Systems  Respiratory:  Positive for shortness of breath.   Neurological:  Positive for numbness.  Psychiatric/Behavioral:  Positive for sleep disturbance.   All other systems reviewed and are negative.   PAST MEDICAL/SURGICAL HISTORY:  No past medical history on file. Past Surgical History:  Procedure Laterality Date   BREAST BIOPSY Right 04/06/2021    SOCIAL HISTORY:  Social History   Socioeconomic History   Marital status: Married    Spouse name: Not on file   Number of children: Not on file   Years of education: Not on file   Highest education level: Not on file  Occupational History   Not on file  Tobacco Use   Smoking status: Never    Smokeless tobacco: Never  Vaping Use   Vaping Use: Never used  Substance and Sexual Activity   Alcohol use: Not Currently    Comment: occasionally   Drug use: Never   Sexual activity: Not Currently  Other Topics Concern   Not on file  Social History Narrative   Not on file   Social Determinants of Health   Financial Resource Strain: Not on file  Food Insecurity: Not on file  Transportation Needs: Not on file  Physical Activity: Not on file  Stress: Not on file  Social Connections: Not on file  Intimate Partner Violence: Not on file    FAMILY HISTORY:  No family history on file.  CURRENT MEDICATIONS:  Current Outpatient Medications  Medication Sig Dispense Refill   anastrozole (ARIMIDEX) 1 MG tablet Take 1 tablet (1 mg total) by mouth daily. 90 tablet 3   busPIRone (BUSPAR) 7.5 MG tablet Take 7.5 mg by mouth 3 (three) times daily.     FLUoxetine (PROZAC) 20 MG capsule Take 60 mg by mouth every morning.     Investigational dexamethasone 4 MG tablet [facility] 16070 Blister Card 2 Take 2 tablets by mouth daily. Take in the morning on Days 2-4. 6 tablet 0   Investigational olanzapine/placebo 10 MG capsule [facility] 16070 Blister Card 2 Take 1 capsule by mouth daily. Take in the morning on Days 2-4. 3 capsule 0   Investigational prochlorperazine maleate/placebo 10 MG capsule [facility]  16070 Blister Card 2 Take 1 capsule by mouth every 8 (eight) hours. Take on Days 1-4. 11 capsule 0   sucralfate (CARAFATE) 1 GM/10ML suspension Take 10 mLs (1 g total) by mouth 4 (four) times daily. Swish and swallow. 360 mL 2   lidocaine-prilocaine (EMLA) cream Apply a small amount to port a cath site and cover with plastic wrap 1 hour prior to infusion appointments (Patient not taking: Reported on 09/12/2021) 30 g 3   prochlorperazine (COMPAZINE) 10 MG tablet Take 1 tablet (10 mg total) by mouth every 6 (six) hours as needed (Nausea or vomiting). (Patient not taking: Reported on 09/12/2021) 60 tablet 3   No current  facility-administered medications for this visit.   Facility-Administered Medications Ordered in Other Visits  Medication Dose Route Frequency Provider Last Rate Last Admin   diphenhydrAMINE (BENADRYL) 50 MG/ML injection            diphenhydrAMINE (BENADRYL) 50 MG/ML injection            famotidine (PEPCID) 20-0.9 MG/50ML-% IVPB            famotidine (PEPCID) 20-0.9 MG/50ML-% IVPB            lanreotide acetate (SOMATULINE DEPOT) 120 MG/0.5ML injection            octreotide (SANDOSTATIN LAR) 30 MG IM injection            palonosetron (ALOXI) 0.25 MG/5ML injection            sodium chloride flush (NS) 0.9 % injection 10 mL  10 mL Intravenous PRN Derek Jack, MD   10 mL at 05/08/21 1235    ALLERGIES:  Allergies  Allergen Reactions   Bupropion Hives    Other reaction(s): seizure    PHYSICAL EXAM:  Performance status (ECOG): 0 - Asymptomatic  Vitals:   09/12/21 1007  Pulse: (!) 117   Wt Readings from Last 3 Encounters:  09/12/21 163 lb 9.6 oz (74.2 kg)  09/05/21 161 lb 6 oz (73.2 kg)  08/30/21 166 lb 7.2 oz (75.5 kg)   Physical Exam Vitals reviewed.  Constitutional:      Appearance: Normal appearance.  Cardiovascular:     Rate and Rhythm: Normal rate and regular rhythm.     Pulses: Normal pulses.     Heart sounds: Normal heart sounds.  Pulmonary:     Effort: Pulmonary effort is normal.     Breath sounds: Normal breath sounds.  Musculoskeletal:     Right lower leg: No edema.     Left lower leg: No edema.  Neurological:     General: No focal deficit present.     Mental Status: She is alert and oriented to person, place, and time.  Psychiatric:        Mood and Affect: Mood normal.        Behavior: Behavior normal.     LABORATORY DATA:  I have reviewed the labs as listed.     Latest Ref Rng & Units 09/12/2021    9:10 AM 09/05/2021    7:43 AM 08/30/2021    9:31 AM  CBC  WBC 4.0 - 10.5 K/uL 6.5  5.1  5.0   Hemoglobin 12.0 - 15.0 g/dL 11.2  11.7  11.0    Hematocrit 36.0 - 46.0 % 33.3  34.2  32.6   Platelets 150 - 400 K/uL 292  297  289       Latest Ref Rng & Units 09/12/2021    9:10 AM  09/05/2021    7:43 AM 08/30/2021    9:31 AM  CMP  Glucose 70 - 99 mg/dL 87  96  87   BUN 6 - 20 mg/dL 11  11  11    Creatinine 0.44 - 1.00 mg/dL 0.69  0.65  0.66   Sodium 135 - 145 mmol/L 139  138  139   Potassium 3.5 - 5.1 mmol/L 3.6  3.7  3.9   Chloride 98 - 111 mmol/L 108  107  109   CO2 22 - 32 mmol/L 22  23  25    Calcium 8.9 - 10.3 mg/dL 9.0  9.1  8.9   Total Protein 6.5 - 8.1 g/dL 6.4  6.6  6.3   Total Bilirubin 0.3 - 1.2 mg/dL 0.6  0.8  0.5   Alkaline Phos 38 - 126 U/L 62  60  60   AST 15 - 41 U/L 89  45  31   ALT 0 - 44 U/L 106  64  57     DIAGNOSTIC IMAGING:  I have independently reviewed the scans and discussed with the patient. No results found.   ASSESSMENT:  Stage II T2N1 left breast invasive lobular carcinoma, ER/PR positive and HER2 negative: - She felt the lump in her left breast before Thanksgiving in 2022.  She missed mammograms for few years prior to that. - Mammogram was done in [facility]. - Left breast biopsy on 01/10/2021: Invasive lobular carcinoma, intermediate grade, ER/PR 90% positive, Ki-67 5 to 10% positive, HER2 negative - Left breast lumpectomy and SLNB on 02/01/2021 by Dr. Matt Holmes - Pathology: Multifocal infiltrating lobular carcinoma, largest focus measuring 3.5 cm, multiple additional nodules present in the specimen particularly in the medial and lateral margins, grade 2, invasive tumor at the inked medial margin, 2/2 positive axillary lymph nodes, Ki-67 variable (40-50% with a number of tumor nodules stain and examined), ER/PR positive, HER2 negative.  Additional anterior medial margin was negative.  No tumor seen at the left breast deep margin tissue. - Patient reportedly had genetic testing done and was told BRCA -7 years ago in [place]. - MRI of the breast on 03/30/2021: Mildly prominent left subpectoral lymph  node measuring 8 mm.  Solitary prominent left axillary lymph node.  5 mm mass in the upper inner quadrant of the right breast. - Right breast biopsy: Pseudoangiomatous stromal hyperplasia, blunt duct adenosis, no malignancy identified.  Left breast intramammary lymph node shows nodal tissue with pigmented histiocytes.  No malignancy. - Oncotype DX: RS-26.  Distal recurrence risk at 9 years on tamoxifen was 21%. - PET scan on 04/20/2021: Postsurgical changes in both breasts without metabolic activity for residual tumor.  Small focus of faint hypermetabolic activity within the left anterior chest wall involving the pectoralis muscle or subpectoral space without corresponding CT finding.  No evidence of distant metastatic disease. - Dose dense AC for 4 cycles from 05/01/2021 through 06/12/2021, weekly paclitaxel started on 06/27/2021, ongoing   Social/family history: - She lives in [place] with her husband.  She works as a Licensed conveyancer.  She is a non-smoker. - Mother had breast cancer at age 42.  Paternal grandmother had thyroid cancer.   PLAN:  Stage II T2N1 invasive lobular carcinoma of left breast: -She has tolerated weekly paclitaxel so far very well.  She has sensitivity of the fingertips and toes without any numbness or tingling.  -Reviewed labs today which showed continued elevation of AST and ALT of 89 and 106 respectively.  It has been elevated  since 08/08/2021.  Likely Taxol induced. - CBC was grossly normal.  She will proceed with cycle 12 of weekly paclitaxel which will conclude the adjuvant chemotherapy. - I will make a referral to Dr. Venia Minks for radiation in [place]. -We talked about initiating antiestrogen therapy.  She currently has no menses.  She has Mirena IUD removed. - We will check estradiol, FSH and LH today.  We talked about starting her on anastrozole 1 mg tablet daily.  We discussed side effects including hot flashes, musculoskeletal symptoms and decreased bone mineral  density. - We will send prescription to her pharmacy.  She will start in 2 to 3 weeks. - If she continues to stay on anastrozole, will obtain bone density test as a baseline in the next few months.  She will continue yearly mammograms. - If she has return of menses, will consider LHRH agonist and anastrozole. - RTC 3 months for follow-up with repeat labs.  2.  Anxiety/depression: - Continue Prozac 60 mg daily and BuSpar 3 times daily as needed.   Orders placed this encounter:  Orders Placed This Encounter  Procedures   Estradiol   Follicle stimulating hormone   Luteinizing hormone      Derek Jack, MD [place] [phone]

## 2021-09-12 NOTE — Patient Instructions (Signed)
[place] at [facility] Discharge Instructions   You were seen and examined today by Dr. Delton Coombes.  He reviewed the results of your lab work which are normal/stable. Your liver enzymes are elevated but should improve in about 4 weeks after being off chemo.  We will proceed with your final treatment today.  We will check hormone levels today.   Return as scheduled in 3 months.    Thank you for choosing [place] at [facility] to provide your oncology and hematology care.  To afford each patient quality time with our provider, please arrive at least 15 minutes before your scheduled appointment time.   If you have a lab appointment with the [facility] please come in thru the Main Entrance and check in at the main information desk.  You need to re-schedule your appointment should you arrive 10 or more minutes late.  We strive to give you quality time with our providers, and arriving late affects you and other patients whose appointments are after yours.  Also, if you no show three or more times for appointments you may be dismissed from the clinic at the providers discretion.     Again, thank you for choosing [facility].  Our hope is that these requests will decrease the amount of time that you wait before being seen by our physicians.       _____________________________________________________________  Should you have questions after your visit to [facility], please contact our office at [phone] and follow the prompts.  Our office hours are 8:00 a.m. and 4:30 p.m. Monday - Friday.  Please note that voicemails left after 4:00 p.m. may not be returned until the following business day.  We are closed weekends and major holidays.  You do have access to a nurse 24-7, just call the main number to the clinic [phone] and do not press any options, hold on the line and a nurse will answer the phone.    For  prescription refill requests, have your pharmacy contact our office and allow 72 hours.    Due to Covid, you will need to wear a mask upon entering the hospital. If you do not have a mask, a mask will be given to you at the Main Entrance upon arrival. For doctor visits, patients may have 1 support person age 59 or older with them. For treatment visits, patients can not have anyone with them due to social distancing guidelines and our immunocompromised population.

## 2021-09-12 NOTE — Progress Notes (Signed)
Patient has been examined by Dr. Delton Coombes, and vital signs (HR 117) and labs have been reviewed. ANC, Creatinine, LFTs (AST 89; ALT 106), hemoglobin, and platelets are within treatment parameters per M.D. - pt may proceed with treatment.  Primary RN and pharmacy notified.

## 2021-09-12 NOTE — Patient Instructions (Signed)
[facility]  Discharge Instructions: Thank you for choosing [facility] to provide your oncology and hematology care.  If you have a lab appointment with the Cancer Center, please come in thru the Main Entrance and check in at the main information desk.  Wear comfortable clothing and clothing appropriate for easy access to any Portacath or PICC line.   We strive to give you quality time with your provider. You may need to reschedule your appointment if you arrive late (15 or more minutes).  Arriving late affects you and other patients whose appointments are after yours.  Also, if you miss three or more appointments without notifying the office, you may be dismissed from the clinic at the provider's discretion.      For prescription refill requests, have your pharmacy contact our office and allow 72 hours for refills to be completed.    Today you received the following chemotherapy and/or immunotherapy agents Taxol, return as scheduled.   To help prevent nausea and vomiting after your treatment, we encourage you to take your nausea medication as directed.  BELOW ARE SYMPTOMS THAT SHOULD BE REPORTED IMMEDIATELY: *FEVER GREATER THAN 100.4 F (38 C) OR HIGHER *CHILLS OR SWEATING *NAUSEA AND VOMITING THAT IS NOT CONTROLLED WITH YOUR NAUSEA MEDICATION *UNUSUAL SHORTNESS OF BREATH *UNUSUAL BRUISING OR BLEEDING *URINARY PROBLEMS (pain or burning when urinating, or frequent urination) *BOWEL PROBLEMS (unusual diarrhea, constipation, pain near the anus) TENDERNESS IN MOUTH AND THROAT WITH OR WITHOUT PRESENCE OF ULCERS (sore throat, sores in mouth, or a toothache) UNUSUAL RASH, SWELLING OR PAIN  UNUSUAL VAGINAL DISCHARGE OR ITCHING   Items with * indicate a potential emergency and should be followed up as soon as possible or go to the Emergency Department if any problems should occur.  Please show the CHEMOTHERAPY ALERT CARD or IMMUNOTHERAPY ALERT CARD at check-in  to the Emergency Department and triage nurse.  Should you have questions after your visit or need to cancel or reschedule your appointment, please contact [facility] [phone]  and follow the prompts.  Office hours are 8:00 a.m. to 4:30 p.m. Monday - Friday. Please note that voicemails left after 4:00 p.m. may not be returned until the following business day.  We are closed weekends and major holidays. You have access to a nurse at all times for urgent questions. Please call the main number to the clinic [phone] and follow the prompts.  For any non-urgent questions, you may also contact your provider using MyChart. We now offer e-Visits for anyone 18 and older to request care online for non-urgent symptoms. For details visit [email or web address].   Also download the MyChart app! Go to the app store, search "MyChart", open the app, select [facility], and log in with your MyChart username and password.  Masks are optional in the cancer centers. If you would like for your care team to wear a mask while they are taking care of you, please let them know. You may have one support person who is at least 48 years old accompany you for your appointments.  

## 2021-09-12 NOTE — Progress Notes (Signed)
Patient presents today for treatment patient, labs and vital signs assessed by Dr. Delton Coombes, patient okay for treatment. Patient tolerated chemotherapy with no complaints voiced. Side effects with management reviewed understanding verbalized. Port site clean and dry with no bruising or swelling noted at site. Good blood return noted before and after administration of chemotherapy. Band aid applied. Patient left in satisfactory condition with VSS and no s/s of distress noted.

## 2021-09-13 ENCOUNTER — Other Ambulatory Visit: Payer: Self-pay

## 2021-09-13 LAB — LUTEINIZING HORMONE: LH: 43.8 m[IU]/mL

## 2021-09-13 LAB — FOLLICLE STIMULATING HORMONE: FSH: 76.8 m[IU]/mL

## 2021-09-13 LAB — ESTRADIOL: Estradiol: 10.3 pg/mL

## 2021-09-25 ENCOUNTER — Encounter: Payer: Self-pay | Admitting: *Deleted

## 2021-09-25 NOTE — Progress Notes (Signed)
Patient sent mychart message about her fingernails coming off and being painful with some clear discharge.  I spoke with provider and she wanted patient to do warm water soaks and for patient to come in to evaluate for infection.  Patient could not come today but left a message stating that she would do the soaks and call us should she notice any changes.  Nothing further needed at this time.

## 2021-12-05 ENCOUNTER — Inpatient Hospital Stay: Payer: BC Managed Care – PPO | Attending: Hematology

## 2021-12-05 ENCOUNTER — Inpatient Hospital Stay: Payer: BC Managed Care – PPO

## 2021-12-05 VITALS — BP 134/75 | HR 89 | Temp 98.5°F | Resp 18

## 2021-12-05 DIAGNOSIS — Z95828 Presence of other vascular implants and grafts: Secondary | ICD-10-CM

## 2021-12-05 DIAGNOSIS — Z452 Encounter for adjustment and management of vascular access device: Secondary | ICD-10-CM | POA: Diagnosis not present

## 2021-12-05 DIAGNOSIS — C50912 Malignant neoplasm of unspecified site of left female breast: Secondary | ICD-10-CM | POA: Insufficient documentation

## 2021-12-05 LAB — COMPREHENSIVE METABOLIC PANEL
ALT: 45 U/L — ABNORMAL HIGH (ref 0–44)
AST: 42 U/L — ABNORMAL HIGH (ref 15–41)
Albumin: 3.8 g/dL (ref 3.5–5.0)
Alkaline Phosphatase: 86 U/L (ref 38–126)
Anion gap: 8 (ref 5–15)
BUN: 12 mg/dL (ref 6–20)
CO2: 25 mmol/L (ref 22–32)
Calcium: 9.4 mg/dL (ref 8.9–10.3)
Chloride: 105 mmol/L (ref 98–111)
Creatinine, Ser: 0.65 mg/dL (ref 0.44–1.00)
GFR, Estimated: >60 mL/min (ref >60)
Glucose, Bld: 88 mg/dL (ref 70–99)
Potassium: 4 mmol/L (ref 3.5–5.1)
Sodium: 138 mmol/L (ref 135–145)
Total Bilirubin: 0.5 mg/dL (ref 0.3–1.2)
Total Protein: 7.4 g/dL (ref 6.5–8.1)

## 2021-12-05 LAB — CBC WITH DIFFERENTIAL/PLATELET
Abs Immature Granulocytes: 0.02 10*3/uL (ref 0.00–0.07)
Basophils Absolute: 0 10*3/uL (ref 0.0–0.1)
Basophils Relative: 1 %
Eosinophils Absolute: 0.1 10*3/uL (ref 0.0–0.5)
Eosinophils Relative: 2 %
HCT: 36.8 % (ref 36.0–46.0)
Hemoglobin: 12.4 g/dL (ref 12.0–15.0)
Immature Granulocytes: 0 %
Lymphocytes Relative: 9 %
Lymphs Abs: 0.5 10*3/uL — ABNORMAL LOW (ref 0.7–4.0)
MCH: 29.5 pg (ref 26.0–34.0)
MCHC: 33.7 g/dL (ref 30.0–36.0)
MCV: 87.4 fL (ref 80.0–100.0)
Monocytes Absolute: 0.5 10*3/uL (ref 0.1–1.0)
Monocytes Relative: 8 %
Neutro Abs: 4.9 10*3/uL (ref 1.7–7.7)
Neutrophils Relative %: 80 %
Platelets: 260 10*3/uL (ref 150–400)
RBC: 4.21 MIL/uL (ref 3.87–5.11)
RDW: 12.6 % (ref 11.5–15.5)
WBC: 6 10*3/uL (ref 4.0–10.5)
nRBC: 0 % (ref 0.0–0.2)

## 2021-12-05 LAB — MAGNESIUM: Magnesium: 2 mg/dL (ref 1.7–2.4)

## 2021-12-05 MED ORDER — HEPARIN SOD (PORK) LOCK FLUSH 100 UNIT/ML IV SOLN
500.0000 [IU] | Freq: Once | INTRAVENOUS | Status: AC
  Administered 2021-12-05: 500 [IU] via INTRAVENOUS

## 2021-12-05 MED ORDER — SODIUM CHLORIDE 0.9% FLUSH
10.0000 mL | INTRAVENOUS | Status: DC | PRN
  Administered 2021-12-05: 10 mL via INTRAVENOUS

## 2021-12-05 NOTE — Progress Notes (Signed)
Debra Vaughn presented for Portacath access and flush. Portacath located right chest wall accessed with  H 20 needle. Good blood return present. Portacath flushed with 47m NS and 500U/542mHeparin and needle removed intact. Procedure without incident. Patient tolerated procedure well.  Vitals stable and discharged home from clinic ambulatory. Follow up as scheduled.

## 2021-12-05 NOTE — Patient Instructions (Signed)
[place]  Discharge Instructions: Thank you for choosing [facility] to provide your oncology and hematology care.  If you have a lab appointment with the [facility], please come in thru the Main Entrance and check in at the main information desk.  Wear comfortable clothing and clothing appropriate for easy access to any Portacath or PICC line.   We strive to give you quality time with your provider. You may need to reschedule your appointment if you arrive late (15 or more minutes).  Arriving late affects you and other patients whose appointments are after yours.  Also, if you miss three or more appointments without notifying the office, you may be dismissed from the clinic at the provider's discretion.      For prescription refill requests, have your pharmacy contact our office and allow 72 hours for refills to be completed.    Today you got your port flushed and labs drawn   To help prevent nausea and vomiting after your treatment, we encourage you to take your nausea medication as directed.  BELOW ARE SYMPTOMS THAT SHOULD BE REPORTED IMMEDIATELY: *FEVER GREATER THAN 100.4 F (38 C) OR HIGHER *CHILLS OR SWEATING *NAUSEA AND VOMITING THAT IS NOT CONTROLLED WITH YOUR NAUSEA MEDICATION *UNUSUAL SHORTNESS OF BREATH *UNUSUAL BRUISING OR BLEEDING *URINARY PROBLEMS (pain or burning when urinating, or frequent urination) *BOWEL PROBLEMS (unusual diarrhea, constipation, pain near the anus) TENDERNESS IN MOUTH AND THROAT WITH OR WITHOUT PRESENCE OF ULCERS (sore throat, sores in mouth, or a toothache) UNUSUAL RASH, SWELLING OR PAIN  UNUSUAL VAGINAL DISCHARGE OR ITCHING   Items with * indicate a potential emergency and should be followed up as soon as possible or go to the Emergency Department if any problems should occur.  Please show the CHEMOTHERAPY ALERT CARD or IMMUNOTHERAPY ALERT CARD at check-in to the Emergency Department and triage  nurse.  Should you have questions after your visit or need to cancel or reschedule your appointment, please contact [facility] [phone]  and follow the prompts.  Office hours are 8:00 a.m. to 4:30 p.m. Monday - Friday. Please note that voicemails left after 4:00 p.m. may not be returned until the following business day.  We are closed weekends and major holidays. You have access to a nurse at all times for urgent questions. Please call the main number to the clinic [phone] and follow the prompts.  For any non-urgent questions, you may also contact your provider using MyChart. We now offer e-Visits for anyone 16 and older to request care online for non-urgent symptoms. For details visit [email or web address].   Also download the MyChart app! Go to the app store, search "MyChart", open the app, select [facility], and log in with your MyChart username and password.  Masks are optional in the cancer centers. If you would like for your care team to wear a mask while they are taking care of you, please let them know. You may have one support person who is at least 48 years old accompany you for your appointments.

## 2021-12-12 ENCOUNTER — Inpatient Hospital Stay: Payer: BC Managed Care – PPO | Attending: Hematology | Admitting: Hematology

## 2021-12-12 ENCOUNTER — Encounter: Payer: Self-pay | Admitting: Hematology

## 2021-12-12 ENCOUNTER — Other Ambulatory Visit: Payer: Self-pay

## 2021-12-12 VITALS — BP 128/84 | HR 111 | Temp 97.5°F | Resp 16 | Wt 162.7 lb

## 2021-12-12 DIAGNOSIS — Z170 Estrogen receptor positive status [ER+]: Secondary | ICD-10-CM | POA: Insufficient documentation

## 2021-12-12 DIAGNOSIS — C50912 Malignant neoplasm of unspecified site of left female breast: Secondary | ICD-10-CM

## 2021-12-12 DIAGNOSIS — Z79811 Long term (current) use of aromatase inhibitors: Secondary | ICD-10-CM | POA: Insufficient documentation

## 2021-12-12 NOTE — Patient Instructions (Addendum)
[facility]  Discharge Instructions  You were seen and examined today by Dr. Delton Coombes.  Dr. Delton Coombes discussed your most recent lab work which revealed that everything looks good except for your AST and ALT levels are slightly elevated just one point above normal.  Start taking combination of Calcium and Vitamin D twice daily. You can have your Port removed after mammogram.  Follow-up as scheduled in 6 months with labs.    Thank you for choosing [facility] to provide your oncology and hematology care.   To afford each patient quality time with our provider, please arrive at least 15 minutes before your scheduled appointment time. You may need to reschedule your appointment if you arrive late (10 or more minutes). Arriving late affects you and other patients whose appointments are after yours.  Also, if you miss three or more appointments without notifying the office, you may be dismissed from the clinic at the provider's discretion.    Again, thank you for choosing [facility].  Our hope is that these requests will decrease the amount of time that you wait before being seen by our physicians.   If you have a lab appointment with the [facility] please come in thru the Main Entrance and check in at the main information desk.           _____________________________________________________________  Should you have questions after your visit to [facility], please contact our office at [phone] and follow the prompts.  Our office hours are 8:00 a.m. to 4:30 p.m. Monday - Thursday and 8:00 a.m. to 2:30 p.m. Friday.  Please note that voicemails left after 4:00 p.m. may not be returned until the following business day.  We are closed weekends and all major holidays.  You do have access to a nurse 24-7, just call the main number to the clinic [phone] and do not press any options, hold on the line and a  nurse will answer the phone.    For prescription refill requests, have your pharmacy contact our office and allow 72 hours.    Masks are optional in the cancer centers. If you would like for your care team to wear a mask while they are taking care of you, please let them know. You may have one support person who is at least 48 years old accompany you for your appointments.

## 2021-12-12 NOTE — Progress Notes (Signed)
[place] [name], [place] [address]   CLINIC:  Medical Oncology/Hematology  PCP:  Jacqualine Code, [address] / [place] [address] [phone]   REASON FOR VISIT:  Follow-up for stage II T2N1 invasive lobular carcinoma of left breast  PRIOR THERAPY: Left breast lumpectomy and SLNB on 02/01/2021 by Dr. Matt Holmes  NGS Results: not done  CURRENT THERAPY: ADJUVANT DOSE DENSE AC q14d / PACLitaxel q7d  BRIEF ONCOLOGIC HISTORY:  Oncology History  Invasive lobular carcinoma of left breast in female Debra Vaughn)  04/11/2021 Initial Diagnosis   Invasive lobular carcinoma of left breast in female Debra Vaughn)   05/01/2021 -  Chemotherapy   Patient is on Treatment Plan : BREAST ADJUVANT DOSE DENSE AC q14d / PACLitaxel q7d       CANCER STAGING:  Cancer Staging  Invasive lobular carcinoma of left breast in female [facility]) Staging form: Breast, AJCC 8th Edition - Clinical stage from 04/11/2021: Stage IIA (cT2, cN1, cM0, G2, ER+, PR+, HER2-) - Unsigned   INTERVAL HISTORY:  [name], a 46 y.o. female, seen for follow-up of breast cancer.  She has completed radiation therapy on 12/10/2021.  She reports fatigue since the start of radiation.  Denies any major hot flashes or arthralgias.  REVIEW OF SYSTEMS:  Review of Systems  Constitutional:  Positive for fatigue.  Neurological:  Positive for numbness (Fingertips and toes).  All other systems reviewed and are negative.   PAST MEDICAL/SURGICAL HISTORY:  History reviewed. No pertinent past medical history. Past Surgical History:  Procedure Laterality Date   BREAST BIOPSY Right 04/06/2021    SOCIAL HISTORY:  Social History   Socioeconomic History   Marital status: Married    Spouse name: Not on file   Number of children: Not on file   Years of education: Not on file   Highest education level: Not on file  Occupational History   Not on file  Tobacco Use   Smoking status: Never   Smokeless  tobacco: Never  Vaping Use   Vaping Use: Never used  Substance and Sexual Activity   Alcohol use: Not Currently    Comment: occasionally   Drug use: Never   Sexual activity: Not Currently  Other Topics Concern   Not on file  Social History Narrative   Not on file   Social Determinants of Health   Financial Resource Strain: Not on file  Food Insecurity: Not on file  Transportation Needs: Not on file  Physical Activity: Not on file  Stress: Not on file  Social Connections: Not on file  Intimate Partner Violence: Not on file    FAMILY HISTORY:  History reviewed. No pertinent family history.  CURRENT MEDICATIONS:  Current Outpatient Medications  Medication Sig Dispense Refill   anastrozole (ARIMIDEX) 1 MG tablet Take 1 tablet (1 mg total) by mouth daily. 90 tablet 3   busPIRone (BUSPAR) 7.5 MG tablet Take 7.5 mg by mouth 3 (three) times daily.     FLUoxetine (PROZAC) 20 MG capsule Take 60 mg by mouth every morning.     Investigational dexamethasone 4 MG tablet URCC 16070 Blister Card 2 Take 2 tablets by mouth daily. Take in the morning on Days 2-4. 6 tablet 0   Investigational olanzapine/placebo 10 MG capsule URCC 16070 Blister Card 2 Take 1 capsule by mouth daily. Take in the morning on Days 2-4. 3 capsule 0   Investigational prochlorperazine maleate/placebo 10 MG capsule URCC 16070 Blister Card 2 Take  1 capsule by mouth every 8 (eight) hours. Take on Days 1-4. 11 capsule 0   lidocaine-prilocaine (EMLA) cream Apply a small amount to port a cath site and cover with plastic wrap 1 hour prior to infusion appointments 30 g 3   prochlorperazine (COMPAZINE) 10 MG tablet Take 1 tablet (10 mg total) by mouth every 6 (six) hours as needed (Nausea or vomiting). 60 tablet 3   sucralfate (CARAFATE) 1 GM/10ML suspension Take 10 mLs (1 g total) by mouth 4 (four) times daily. Swish and swallow. 360 mL 2   No current facility-administered medications for this visit.   Facility-Administered  Medications Ordered in Other Visits  Medication Dose Route Frequency Provider Last Rate Last Admin   diphenhydrAMINE (BENADRYL) 50 MG/ML injection            diphenhydrAMINE (BENADRYL) 50 MG/ML injection            famotidine (PEPCID) 20-0.9 MG/50ML-% IVPB            famotidine (PEPCID) 20-0.9 MG/50ML-% IVPB            lanreotide acetate (SOMATULINE DEPOT) 120 MG/0.5ML injection            octreotide (SANDOSTATIN LAR) 30 MG IM injection            palonosetron (ALOXI) 0.25 MG/5ML injection            sodium chloride flush (NS) 0.9 % injection 10 mL  10 mL Intravenous PRN Derek Jack, MD   10 mL at 05/08/21 1235    ALLERGIES:  Allergies  Allergen Reactions   Bupropion Hives    Other reaction(s): seizure    PHYSICAL EXAM:  Performance status (ECOG): 0 - Asymptomatic  Vitals:   12/12/21 1502  BP: 128/84  Pulse: (!) 111  Resp: 16  Temp: (!) 97.5 F (36.4 C)  SpO2: 98%   Wt Readings from Last 3 Encounters:  12/12/21 162 lb 11.2 oz (73.8 kg)  09/12/21 163 lb 9.6 oz (74.2 kg)  09/05/21 161 lb 6 oz (73.2 kg)   Physical Exam Vitals reviewed.  Constitutional:      Appearance: Normal appearance.  Cardiovascular:     Rate and Rhythm: Normal rate and regular rhythm.     Pulses: Normal pulses.     Heart sounds: Normal heart sounds.  Pulmonary:     Effort: Pulmonary effort is normal.     Breath sounds: Normal breath sounds.  Musculoskeletal:     Right lower leg: No edema.     Left lower leg: No edema.  Neurological:     General: No focal deficit present.     Mental Status: She is alert and oriented to person, place, and time.  Psychiatric:        Mood and Affect: Mood normal.        Behavior: Behavior normal.     LABORATORY DATA:  I have reviewed the labs as listed.     Latest Ref Rng & Units 12/05/2021   12:30 PM 09/12/2021    9:10 AM 09/05/2021    7:43 AM  CBC  WBC 4.0 - 10.5 K/uL 6.0  6.5  5.1   Hemoglobin 12.0 - 15.0 g/dL 12.4  11.2  11.7   Hematocrit  36.0 - 46.0 % 36.8  33.3  34.2   Platelets 150 - 400 K/uL 260  292  297       Latest Ref Rng & Units 12/05/2021   12:30 PM 09/12/2021  9:10 AM 09/05/2021    7:43 AM  CMP  Glucose 70 - 99 mg/dL 88  87  96   BUN 6 - 20 mg/dL _0 Creatinine 0.44 - 1.00 mg/dL 0.65  0.69  0.65   Sodium 135 - 145 mmol/L 138  139  138   Potassium 3.5 - 5.1 mmol/L 4.0  3.6  3.7   Chloride 98 - 111 mmol/L 105  108  107   CO2 22 - 32 mmol/L _1 Calcium 8.9 - 10.3 mg/dL 9.4  9.0  9.1   Total Protein 6.5 - 8.1 g/dL 7.4  6.4  6.6   Total Bilirubin 0.3 - 1.2 mg/dL 0.5  0.6  0.8   Alkaline Phos 38 - 126 U/L 86  62  60   AST 15 - 41 U/L 42  89  45   ALT 0 - 44 U/L 45  106  64     DIAGNOSTIC IMAGING:  I have independently reviewed the scans and discussed with the patient. No results found.   ASSESSMENT:  Stage II T2N1 left breast invasive lobular carcinoma, ER/PR positive and HER2 negative: - She felt the lump in her left breast before Thanksgiving in 2022.  She missed mammograms for few years prior to that. - Mammogram was done [facility]. - Left breast biopsy on 01/10/2021: Invasive lobular carcinoma, intermediate grade, ER/PR 90% positive, Ki-67 5 to 10% positive, HER2 negative - Left breast lumpectomy and SLNB on 02/01/2021 by Dr. Matt Holmes - Pathology: Multifocal infiltrating lobular carcinoma, largest focus measuring 3.5 cm, multiple additional nodules present in the specimen particularly in the medial and lateral margins, grade 2, invasive tumor at the inked medial margin, 2/2 positive axillary lymph nodes, Ki-67 variable (40-50% with a number of tumor nodules stain and examined), ER/PR positive, HER2 negative.  Additional anterior medial margin was negative.  No tumor seen at the left breast deep margin tissue. - Patient reportedly had genetic testing done and was told BRCA -7 years ago in [place]. - MRI of the breast on 03/30/2021: Mildly prominent left subpectoral lymph node measuring  8 mm.  Solitary prominent left axillary lymph node.  5 mm mass in the upper inner quadrant of the right breast. - Right breast biopsy: Pseudoangiomatous stromal hyperplasia, blunt duct adenosis, no malignancy identified.  Left breast intramammary lymph node shows nodal tissue with pigmented histiocytes.  No malignancy. - Oncotype DX: RS-26.  Distal recurrence risk at 9 years on tamoxifen was 21%. - PET scan on 04/20/2021: Postsurgical changes in both breasts without metabolic activity for residual tumor.  Small focus of faint hypermetabolic activity within the left anterior chest wall involving the pectoralis muscle or subpectoral space without corresponding CT finding.  No evidence of distant metastatic disease. - Dose dense AC for 4 cycles from 05/01/2021 through 06/12/2021, weekly paclitaxel started from 06/27/2021 through 09/12/2021.  XRT to the left breast and chest wall completed on 12/10/2021.  Anastrozole started on 09/12/2021   Social/family history: - She lives in [place] with her husband.  She works as a Licensed conveyancer.  She is a non-smoker. - Mother had breast cancer at age 4.  Paternal grandmother had thyroid cancer.   PLAN:  Stage II T2N1 invasive lobular carcinoma of left breast: - She has completed XRT on 12/10/2021. - She is tolerating anastrozole very well.  No significant hot flashes or musculoskeletal symptoms. - Reviewed labs including LH and estradiol which are  in the postmenopausal range. - Labs from today shows improved AST and ALT although very mildly elevated.  Other labs including CBC and renal function are within normal limits. - Continue anastrozole daily. - She is having mammogram scheduled in [place] in the next month.  If mammogram is normal, she wants to have port removed as it is causing psychological stress.  She may have it removed with Dr. Matt Holmes. - RTC 6 months for follow-up with labs including vitamin D, CBCD, CMP done on the same day.  2.  Anxiety/depression: -  Continue Prozac 60 mg daily and BuSpar 3 times daily as needed.  3.  Bone health: - I have recommended that she obtain a baseline DEXA scan with Dr. Elvina Mattes. - I have recommended her to start taking calcium plus vitamin D twice daily.   Orders placed this encounter:  Orders Placed This Encounter  Procedures   CBC with Differential/Platelet   Comprehensive metabolic panel   VITAMIN D 25 Hydroxy (Vit-D Deficiency, Fractures)      Derek Jack, MD [place] [phone]

## 2021-12-14 ENCOUNTER — Other Ambulatory Visit: Payer: Self-pay

## 2021-12-16 ENCOUNTER — Other Ambulatory Visit: Payer: Self-pay

## 2022-04-09 ENCOUNTER — Other Ambulatory Visit: Payer: Self-pay

## 2022-06-07 ENCOUNTER — Ambulatory Visit (INDEPENDENT_AMBULATORY_CARE_PROVIDER_SITE_OTHER): Payer: BC Managed Care – PPO | Admitting: Plastic Surgery

## 2022-06-07 ENCOUNTER — Encounter: Payer: Self-pay | Admitting: Plastic Surgery

## 2022-06-07 VITALS — BP 138/85 | HR 95 | Ht 67.0 in | Wt 163.0 lb

## 2022-06-07 DIAGNOSIS — N6489 Other specified disorders of breast: Secondary | ICD-10-CM | POA: Diagnosis not present

## 2022-06-07 DIAGNOSIS — Z803 Family history of malignant neoplasm of breast: Secondary | ICD-10-CM | POA: Diagnosis not present

## 2022-06-07 DIAGNOSIS — Z923 Personal history of irradiation: Secondary | ICD-10-CM

## 2022-06-07 DIAGNOSIS — C50912 Malignant neoplasm of unspecified site of left female breast: Secondary | ICD-10-CM

## 2022-06-07 NOTE — Progress Notes (Signed)
Patient ID: Debra Vaughn, female    DOB: 08-08-1975, 47 y.o.   MRN: [id number]   Chief Complaint  Patient presents with   Consult   Breast Problem    The patient is a 47 year old female here for evaluation of her breasts.  She was diagnosed with invasive lobular carcinoma of the left breast stage IIa.  She was treated with a partial mastectomy of the left breast followed by radiation.  The tumor was estrogen and progesterone positive and HER2 negative.  She felt the lump around Thanksgiving of 2022 and had missed a few mammograms.  Once she had it evaluated the diagnosis was found to be as described above.  The right breast was diagnosed with pseudoangiomatous stromal hyperplasia and no malignancy.  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  Her mom had breast cancer.  That she is interested in improved symmetry.  She is aware of the increased risks to the radiated breast so she would like to get the right breast to match.      Review of Systems  Constitutional: Negative.   HENT: Negative.    Eyes: Negative.   Respiratory: Negative.  Negative for chest tightness.   Cardiovascular: Negative.   Gastrointestinal: Negative.   Endocrine: Negative.   Genitourinary: Negative.   Musculoskeletal: Negative.     History reviewed. No pertinent past medical history.  Past Surgical History:  Procedure Laterality Date   BREAST BIOPSY Right 04/06/2021      Current Outpatient Medications:    anastrozole (ARIMIDEX) 1 MG tablet, Take 1 tablet (1 mg total) by mouth daily., Disp: 90 tablet, Rfl: 3   busPIRone (BUSPAR) 7.5 MG tablet, Take 7.5 mg by mouth 3 (three) times daily., Disp: , Rfl:    FLUoxetine (PROZAC) 20 MG capsule, Take 60 mg by mouth every morning., Disp: , Rfl:    Investigational dexamethasone 4 MG tablet URCC 16070 Blister Card 2, Take 2 tablets by mouth daily. Take in the morning on Days 2-4., Disp: 6 tablet, Rfl: 0   Investigational olanzapine/placebo 10 MG capsule URCC 16070 Blister  Card 2, Take 1 capsule by mouth daily. Take in the morning on Days 2-4., Disp: 3 capsule, Rfl: 0   Investigational prochlorperazine maleate/placebo 10 MG capsule URCC 16070 Blister Card 2, Take 1 capsule by mouth every 8 (eight) hours. Take on Days 1-4., Disp: 11 capsule, Rfl: 0   lidocaine-prilocaine (EMLA) cream, Apply a small amount to port a cath site and cover with plastic wrap 1 hour prior to infusion appointments, Disp: 30 g, Rfl: 3   prochlorperazine (COMPAZINE) 10 MG tablet, Take 1 tablet (10 mg total) by mouth every 6 (six) hours as needed (Nausea or vomiting)., Disp: 60 tablet, Rfl: 3   sucralfate (CARAFATE) 1 GM/10ML suspension, Take 10 mLs (1 g total) by mouth 4 (four) times daily. Swish and swallow., Disp: 360 mL, Rfl: 2 No current facility-administered medications for this visit.  Facility-Administered Medications Ordered in Other Visits:    diphenhydrAMINE (BENADRYL) 50 MG/ML injection, , , ,    diphenhydrAMINE (BENADRYL) 50 MG/ML injection, , , ,    famotidine (PEPCID) 20-0.9 MG/50ML-% IVPB, , , ,    famotidine (PEPCID) 20-0.9 MG/50ML-% IVPB, , , ,    lanreotide acetate (SOMATULINE DEPOT) 120 MG/0.5ML injection, , , ,    octreotide (SANDOSTATIN LAR) 30 MG IM injection, , , ,    palonosetron (ALOXI) 0.25 MG/5ML injection, , , ,    sodium chloride flush (  NS) 0.9 % injection 10 mL, 10 mL, Intravenous, PRN, Doreatha Massed, MD, 10 mL at 05/08/21 1235   Objective:   Vitals:   06/07/22 1221  BP: 138/85  Pulse: 95  SpO2: 97%    Physical Exam Vitals and nursing note reviewed.  Constitutional:      Appearance: Normal appearance.  HENT:     Head: Normocephalic and atraumatic.  Cardiovascular:     Rate and Rhythm: Normal rate.     Pulses: Normal pulses.  Pulmonary:     Effort: Pulmonary effort is normal.  Abdominal:     Palpations: Abdomen is soft.  Skin:    General: Skin is warm.     Capillary Refill: Capillary refill takes less than 2 seconds.  Neurological:      Mental Status: She is alert and oriented to person, place, and time.  Psychiatric:        Mood and Affect: Mood normal.        Behavior: Behavior normal.        Thought Content: Thought content normal.        Judgment: Judgment normal.     Assessment & Plan:  Invasive lobular carcinoma of left breast in female [facility])  The patient is a good candidate for right breast reduction for symmetry.  We talked about decreasing her carbs and sugar intake and improving her protein in order to get ready for healing after the surgery.  Pictures were obtained of the patient and placed in the chart with the patient's or guardian's permission.   Alena Bills Atul Delucia, DO

## 2022-06-19 ENCOUNTER — Telehealth: Payer: Self-pay

## 2022-06-19 ENCOUNTER — Encounter: Payer: Self-pay | Admitting: Medical Oncology

## 2022-06-19 ENCOUNTER — Inpatient Hospital Stay: Payer: BC Managed Care – PPO | Attending: Hematology | Admitting: Hematology

## 2022-06-19 ENCOUNTER — Other Ambulatory Visit: Payer: BC Managed Care – PPO

## 2022-06-19 ENCOUNTER — Encounter: Payer: Self-pay | Admitting: Hematology

## 2022-06-19 ENCOUNTER — Inpatient Hospital Stay: Payer: BC Managed Care – PPO

## 2022-06-19 VITALS — BP 125/89 | HR 101 | Temp 98.2°F | Resp 18 | Wt 161.3 lb

## 2022-06-19 DIAGNOSIS — C50912 Malignant neoplasm of unspecified site of left female breast: Secondary | ICD-10-CM

## 2022-06-19 DIAGNOSIS — Z170 Estrogen receptor positive status [ER+]: Secondary | ICD-10-CM | POA: Diagnosis not present

## 2022-06-19 DIAGNOSIS — Z79811 Long term (current) use of aromatase inhibitors: Secondary | ICD-10-CM | POA: Diagnosis not present

## 2022-06-19 LAB — COMPREHENSIVE METABOLIC PANEL
ALT: 18 U/L (ref 0–44)
AST: 22 U/L (ref 15–41)
Albumin: 4.2 g/dL (ref 3.5–5.0)
Alkaline Phosphatase: 90 U/L (ref 38–126)
Anion gap: 11 (ref 5–15)
BUN: 17 mg/dL (ref 6–20)
CO2: 25 mmol/L (ref 22–32)
Calcium: 9.5 mg/dL (ref 8.9–10.3)
Chloride: 102 mmol/L (ref 98–111)
Creatinine, Ser: 0.82 mg/dL (ref 0.44–1.00)
GFR, Estimated: >60 mL/min (ref >60)
Glucose, Bld: 101 mg/dL — ABNORMAL HIGH (ref 70–99)
Potassium: 4.4 mmol/L (ref 3.5–5.1)
Sodium: 138 mmol/L (ref 135–145)
Total Bilirubin: 0.6 mg/dL (ref 0.3–1.2)
Total Protein: 7.6 g/dL (ref 6.5–8.1)

## 2022-06-19 LAB — CBC WITH DIFFERENTIAL/PLATELET
Abs Immature Granulocytes: 0.02 10*3/uL (ref 0.00–0.07)
Basophils Absolute: 0 10*3/uL (ref 0.0–0.1)
Basophils Relative: 0 %
Eosinophils Absolute: 0.1 10*3/uL (ref 0.0–0.5)
Eosinophils Relative: 2 %
HCT: 38.1 % (ref 36.0–46.0)
Hemoglobin: 13 g/dL (ref 12.0–15.0)
Immature Granulocytes: 0 %
Lymphocytes Relative: 15 %
Lymphs Abs: 1.1 10*3/uL (ref 0.7–4.0)
MCH: 29.7 pg (ref 26.0–34.0)
MCHC: 34.1 g/dL (ref 30.0–36.0)
MCV: 87.2 fL (ref 80.0–100.0)
Monocytes Absolute: 0.6 10*3/uL (ref 0.1–1.0)
Monocytes Relative: 8 %
Neutro Abs: 5.7 10*3/uL (ref 1.7–7.7)
Neutrophils Relative %: 75 %
Platelets: 272 10*3/uL (ref 150–400)
RBC: 4.37 MIL/uL (ref 3.87–5.11)
RDW: 12.4 % (ref 11.5–15.5)
WBC: 7.6 10*3/uL (ref 4.0–10.5)
nRBC: 0 % (ref 0.0–0.2)

## 2022-06-19 LAB — VITAMIN D 25 HYDROXY (VIT D DEFICIENCY, FRACTURES): Vit D, 25-Hydroxy: 32.31 ng/mL (ref 30–100)

## 2022-06-19 LAB — MAGNESIUM: Magnesium: 1.9 mg/dL (ref 1.7–2.4)

## 2022-06-19 NOTE — Telephone Encounter (Signed)
Patient called and left VM regarding No Authorization has been sent or approved through her insurance company.  Patient verbalized wanting to know when she can be scheduled for surgery and when the authorization will be sent and approved.  Please follow up with patient @ [phone]

## 2022-06-19 NOTE — Research (Signed)
URCC 18007: RANDOMIZED [place] CONTROLLED TRIAL OF BUPROPION FOR CANCER RELATED FATIGUE   Patient is a potential for eligibility for study. Met with patient this afternoon to introduce study.   Patient in clinic for scheduled appointment with Dr. Ellin Saba. Patient has documented history of reported fatigued. I provided patient with a brief explanation of this study and the study drug involved, Wellbutrin (Bupropion). Patient informed me that she is allergic to Wellbutrin, states it causes her "seizures."  I thanked patient for her time.   Rexene Edison, RN, BSN, [facility] Clinical Research Nurse Lead 06/19/2022 3:01 PM

## 2022-06-19 NOTE — Patient Instructions (Addendum)
[facility]  Discharge Instructions  You were seen and examined today by Dr. Ellin Saba.  Dr. Ellin Saba discussed your most recent lab work which revealed that everything looks good.   Continue taking the Anastrozole as prescribed. If you need a refill please call our office.  Follow-up as scheduled in 6 months.    Thank you for choosing [facility] to provide your oncology and hematology care.   To afford each patient quality time with our provider, please arrive at least 15 minutes before your scheduled appointment time. You may need to reschedule your appointment if you arrive late (10 or more minutes). Arriving late affects you and other patients whose appointments are after yours.  Also, if you miss three or more appointments without notifying the office, you may be dismissed from the clinic at the provider's discretion.    Again, thank you for choosing [facility].  Our hope is that these requests will decrease the amount of time that you wait before being seen by our physicians.   If you have a lab appointment with the Cancer Center - please note that after April 8th, all labs will be drawn in the cancer center.  You do not have to check in or register with the main entrance as you have in the past but will complete your check-in at the cancer center.            _____________________________________________________________  Should you have questions after your visit to [facility], please contact our office at [phone] and follow the prompts.  Our office hours are 8:00 a.m. to 4:30 p.m. Monday - Thursday and 8:00 a.m. to 2:30 p.m. Friday.  Please note that voicemails left after 4:00 p.m. may not be returned until the following business day.  We are closed weekends and all major holidays.  You do have access to a nurse 24-7, just call the main number to the clinic [phone] and do not press any  options, hold on the line and a nurse will answer the phone.    For prescription refill requests, have your pharmacy contact our office and allow 72 hours.    Masks are no longer required in the cancer centers. If you would like for your care team to wear a mask while they are taking care of you, please let them know. You may have one support person who is at least 49 years old accompany you for your appointments.

## 2022-06-19 NOTE — Progress Notes (Signed)
[facility] [address], [place] [address]    Clinic Day:  07/19/2022  Referring physician: Joaquin Courts, DO  Patient Care Team: Joaquin Courts, DO as PCP - General (Family Medicine) Doreatha Massed, MD as Medical Oncologist (Medical Oncology) Therese Sarah, RN as Oncology Nurse Navigator (Medical Oncology)   ASSESSMENT & PLAN:   Assessment: 1. 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  She missed mammograms for few years prior to that. - Mammogram was done in [facility] - Left breast biopsy on 01/10/2021: Invasive lobular carcinoma, intermediate grade, ER/PR 90% positive, Ki-67 5 to 10% positive, HER2 negative - Left breast lumpectomy and SLNB on 02/01/2021 by Dr. Eulah Citizen - Pathology: Multifocal infiltrating lobular carcinoma, largest focus measuring 3.5 cm, multiple additional nodules present in the specimen particularly in the medial and lateral margins, grade 2, invasive tumor at the inked medial margin, 2/2 positive axillary lymph nodes, Ki-67 variable (40-50% with a number of tumor nodules stain and examined), ER/PR positive, HER2 negative.  Additional anterior medial margin was negative.  No tumor seen at the left breast deep margin tissue. - Patient reportedly had genetic testing done and was told BRCA -7 years ago in [place]. - MRI of the breast on 03/30/2021: Mildly prominent left subpectoral lymph node measuring 8 mm.  Solitary prominent left axillary lymph node.  5 mm mass in the upper inner quadrant of the right breast. - Right breast biopsy: Pseudoangiomatous stromal hyperplasia, blunt duct adenosis, no malignancy identified.  Left breast intramammary lymph node shows nodal tissue with pigmented histiocytes.  No malignancy. - Oncotype DX: [id number].  Distal recurrence risk at 9 years on tamoxifen was 21%. - PET scan on  04/20/2021: Postsurgical changes in both breasts without metabolic activity for residual tumor.  Small focus of faint hypermetabolic activity within the left anterior chest wall involving the pectoralis muscle or subpectoral space without corresponding CT finding.  No evidence of distant metastatic disease. - Dose dense AC for 4 cycles from 05/01/2021 through 06/12/2021, weekly paclitaxel started from 06/27/2021 through 09/12/2021.  XRT to the left breast and chest wall completed on 12/10/2021.  Anastrozole started on 09/12/2021   2. Social/family history: - She lives in [place] with her husband.  She works as a Comptroller.  She is a non-smoker. - Mother had breast cancer at age 40.  Paternal grandmother had thyroid cancer.    Plan: 1. Stage II T2N1 invasive lobular carcinoma of left breast: - She is tolerating anastrozole fairly well.  She has some hot flashes during daytime. - She has not started her menses yet. - She had port removed by Dr. Eulah Citizen.  Also had mammogram in December and MRI in May which were negative in [place]. - She met with Dr. Ulice Bold for right breast reduction surgery. - She had.  Of depression from January through March this year. - She has some numbness in the fingertips on and off but not hurting. - Continue anastrozole daily. - RTC 6 months with labs on the same day.  2.  Anxiety/depression: - Continue Prozac 60 mg daily and BuSpar 3 times daily as needed.   3.  Bone health: - Obtain DEXA scan with Dr. Michel Santee. - Continue calcium and vitamin D supplements.  Vitamin D level is 32.31.    Orders Placed This Encounter  Procedures   CBC with Differential/Platelet  Standing Status:   Future    Standing Expiration Date:   06/19/2023    Order Specific Question:   Release to patient    Answer:   Immediate   Comprehensive metabolic panel    Standing Status:   Future    Standing Expiration Date:   06/19/2023    Order Specific Question:   Release to patient     Answer:   Immediate   VITAMIN D 25 Hydroxy (Vit-D Deficiency, Fractures)    Standing Status:   Future    Standing Expiration Date:   06/19/2023    Order Specific Question:   Release to patient    Answer:   Immediate      I,Katie Daubenspeck,acting as a scribe for Doreatha Massed, MD.,have documented all relevant documentation on the behalf of Doreatha Massed, MD,as directed by  Doreatha Massed, MD while in the presence of Doreatha Massed, MD.   I, Doreatha Massed MD, have reviewed the above documentation for accuracy and completeness, and I agree with the above.   Doreatha Massed, MD   7/12/20246:01 PM  CHIEF COMPLAINT:   Diagnosis: left breast invasive lobular carcinoma    Cancer Staging  Invasive lobular carcinoma of left breast in female [facility]) Staging form: Breast, AJCC 8th Edition - Clinical stage from 04/11/2021: Stage IIA (cT2, cN1, cM0, G2, ER+, PR+, HER2-) - Unsigned    Prior Therapy: 1. Left breast lumpectomy and SLNB on 02/01/2021 by Dr. Eulah Citizen  2. Adjuvant ACT, 4 cycles 05/01/21 - 06/12/21 3. Weekly paclitaxel, 06/27/21 - 09/12/21 4. XRT to left breast, completed 12/10/21  Current Therapy:  anastrozole   HISTORY OF PRESENT ILLNESS:   Oncology History  Invasive lobular carcinoma of left breast in female [facility])  04/11/2021 Initial Diagnosis   Invasive lobular carcinoma of left breast in female [facility])   05/01/2021 -  Chemotherapy   Patient is on Treatment Plan : BREAST ADJUVANT DOSE DENSE AC q14d / PACLitaxel q7d        INTERVAL HISTORY:   Yaslin is a 47 y.o. female presenting to clinic today for follow up of left breast invasive lobular carcinoma. She was last seen by me on 12/12/21.  Today, she states that she is doing well overall. Her appetite level is at 100%. Her energy level is at 100%.  PAST MEDICAL HISTORY:   Past Medical History: History reviewed. No pertinent past medical history.  Surgical History: Past Surgical History:  Procedure  Laterality Date   BREAST BIOPSY Right 04/06/2021    Social History: Social History   Socioeconomic History   Marital status: Married    Spouse name: Not on file   Number of children: Not on file   Years of education: Not on file   Highest education level: Not on file  Occupational History   Not on file  Tobacco Use   Smoking status: Never   Smokeless tobacco: Never  Vaping Use   Vaping status: Never Used  Substance and Sexual Activity   Alcohol use: Not Currently    Comment: occasionally   Drug use: Never   Sexual activity: Not Currently  Other Topics Concern   Not on file  Social History Narrative   Not on file   Social Determinants of Health   Financial Resource Strain: Not on file  Food Insecurity: Not on file  Transportation Needs: Not on file  Physical Activity: Insufficiently Active (11/03/2018)   Received from [facility]   Exercise Vital Sign    Days of  Exercise per Week: 5 days    Minutes of Exercise per Session: 20 min  Stress: Not on file  Social Connections: Not on file  Intimate Partner Violence: Not on file    Family History: History reviewed. No pertinent family history.  Current Medications:  Current Outpatient Medications:    anastrozole (ARIMIDEX) 1 MG tablet, Take 1 tablet (1 mg total) by mouth daily., Disp: 90 tablet, Rfl: 3   busPIRone (BUSPAR) 7.5 MG tablet, Take 7.5 mg by mouth 3 (three) times daily., Disp: , Rfl:    FLUoxetine (PROZAC) 20 MG capsule, Take 60 mg by mouth every morning., Disp: , Rfl:  No current facility-administered medications for this visit.  Facility-Administered Medications Ordered in Other Visits:    diphenhydrAMINE (BENADRYL) 50 MG/ML injection, , , ,    diphenhydrAMINE (BENADRYL) 50 MG/ML injection, , , ,    famotidine (PEPCID) 20-0.9 MG/50ML-% IVPB, , , ,    famotidine (PEPCID) 20-0.9 MG/50ML-% IVPB, , , ,    lanreotide acetate (SOMATULINE DEPOT) 120 MG/0.5ML injection, , , ,    octreotide (SANDOSTATIN  LAR) 30 MG IM injection, , , ,    palonosetron (ALOXI) 0.25 MG/5ML injection, , , ,    sodium chloride flush (NS) 0.9 % injection 10 mL, 10 mL, Intravenous, PRN, Doreatha Massed, MD, 10 mL at 05/08/21 1235   Allergies: Allergies  Allergen Reactions   Bupropion Hives    Other reaction(s): seizure    REVIEW OF SYSTEMS:   Review of Systems  Constitutional:  Negative for chills, fatigue and fever.  HENT:   Negative for lump/mass, mouth sores, nosebleeds, sore throat and trouble swallowing.   Eyes:  Negative for eye problems.  Respiratory:  Negative for cough and shortness of breath.   Cardiovascular:  Negative for chest pain, leg swelling and palpitations.  Gastrointestinal:  Negative for abdominal pain, constipation, diarrhea, nausea and vomiting.  Genitourinary:  Negative for bladder incontinence, difficulty urinating, dysuria, frequency, hematuria and nocturia.   Musculoskeletal:  Negative for arthralgias, back pain, flank pain, myalgias and neck pain.  Skin:  Negative for itching and rash.  Neurological:  Positive for numbness. Negative for dizziness and headaches.  Hematological:  Does not bruise/bleed easily.  Psychiatric/Behavioral:  Negative for depression, sleep disturbance and suicidal ideas. The patient is not nervous/anxious.   All other systems reviewed and are negative.    VITALS:   Blood pressure 125/89, pulse (!) 101, temperature 98.2 F (36.8 C), temperature source Oral, resp. rate 18, weight 161 lb 4.8 oz (73.2 kg), SpO2 98%.  Wt Readings from Last 3 Encounters:  06/19/22 161 lb 4.8 oz (73.2 kg)  06/07/22 163 lb (73.9 kg)  12/12/21 162 lb 11.2 oz (73.8 kg)    Body mass index is 25.26 kg/m.  Performance status (ECOG): 1 - Symptomatic but completely ambulatory  PHYSICAL EXAM:   Physical Exam Vitals and nursing note reviewed. Exam conducted with a chaperone present.  Constitutional:      Appearance: Normal appearance.  Cardiovascular:     Rate and  Rhythm: Normal rate and regular rhythm.     Pulses: Normal pulses.     Heart sounds: Normal heart sounds.  Pulmonary:     Effort: Pulmonary effort is normal.     Breath sounds: Normal breath sounds.  Abdominal:     Palpations: Abdomen is soft. There is no hepatomegaly, splenomegaly or mass.     Tenderness: There is no abdominal tenderness.  Musculoskeletal:     Right lower leg:  No edema.     Left lower leg: No edema.  Lymphadenopathy:     Cervical: No cervical adenopathy.     Right cervical: No superficial, deep or posterior cervical adenopathy.    Left cervical: No superficial, deep or posterior cervical adenopathy.     Upper Body:     Right upper body: No supraclavicular or axillary adenopathy.     Left upper body: No supraclavicular or axillary adenopathy.  Neurological:     General: No focal deficit present.     Mental Status: She is alert and oriented to person, place, and time.  Psychiatric:        Mood and Affect: Mood normal.        Behavior: Behavior normal.     LABS:      Latest Ref Rng & Units 06/19/2022    2:02 PM 12/05/2021   12:30 PM 09/12/2021    9:10 AM  CBC  WBC 4.0 - 10.5 K/uL 7.6  6.0  6.5   Hemoglobin 12.0 - 15.0 g/dL 40.9  81.1  91.4   Hematocrit 36.0 - 46.0 % 38.1  36.8  33.3   Platelets 150 - 400 K/uL 272  260  292       Latest Ref Rng & Units 06/19/2022    2:02 PM 12/05/2021   12:30 PM 09/12/2021    9:10 AM  CMP  Glucose 70 - 99 mg/dL 782  88  87   BUN 6 - 20 mg/dL 17  12  11    Creatinine 0.44 - 1.00 mg/dL 9.56  2.13  0.86   Sodium 135 - 145 mmol/L 138  138  139   Potassium 3.5 - 5.1 mmol/L 4.4  4.0  3.6   Chloride 98 - 111 mmol/L 102  105  108   CO2 22 - 32 mmol/L 25  25  22    Calcium 8.9 - 10.3 mg/dL 9.5  9.4  9.0   Total Protein 6.5 - 8.1 g/dL 7.6  7.4  6.4   Total Bilirubin 0.3 - 1.2 mg/dL 0.6  0.5  0.6   Alkaline Phos 38 - 126 U/L 90  86  62   AST 15 - 41 U/L 22  42  89   ALT 0 - 44 U/L 18  45  106      No results found for:  "CEA1", "CEA" / No results found for: "CEA1", "CEA" No results found for: "PSA1" No results found for: "VHQ469" No results found for: "CAN125"  No results found for: "TOTALPROTELP", "ALBUMINELP", "A1GS", "A2GS", "BETS", "BETA2SER", "GAMS", "MSPIKE", "SPEI" No results found for: "TIBC", "FERRITIN", "IRONPCTSAT" No results found for: "LDH"   STUDIES:   No results found.

## 2022-06-20 ENCOUNTER — Other Ambulatory Visit: Payer: Self-pay

## 2022-07-09 ENCOUNTER — Other Ambulatory Visit: Payer: Self-pay

## 2022-07-19 ENCOUNTER — Encounter: Payer: Self-pay | Admitting: Hematology

## 2022-08-21 ENCOUNTER — Telehealth: Payer: Self-pay | Admitting: *Deleted

## 2022-08-21 NOTE — Telephone Encounter (Signed)
Patient no longer interested in surgery

## 2022-09-05 ENCOUNTER — Other Ambulatory Visit: Payer: Self-pay | Admitting: Hematology

## 2022-09-05 ENCOUNTER — Other Ambulatory Visit: Payer: Self-pay | Admitting: *Deleted

## 2022-09-05 NOTE — Telephone Encounter (Signed)
Anastrozole refill approved.  Patient is tolerating and is to continue therapy.

## 2022-12-19 ENCOUNTER — Inpatient Hospital Stay: Payer: BC Managed Care – PPO

## 2022-12-19 ENCOUNTER — Inpatient Hospital Stay: Payer: BC Managed Care – PPO | Attending: Hematology | Admitting: Hematology

## 2022-12-19 VITALS — BP 123/79 | HR 92 | Temp 98.3°F | Resp 18 | Wt 151.2 lb

## 2022-12-19 DIAGNOSIS — C50912 Malignant neoplasm of unspecified site of left female breast: Secondary | ICD-10-CM | POA: Insufficient documentation

## 2022-12-19 DIAGNOSIS — Z79811 Long term (current) use of aromatase inhibitors: Secondary | ICD-10-CM | POA: Insufficient documentation

## 2022-12-19 DIAGNOSIS — Z170 Estrogen receptor positive status [ER+]: Secondary | ICD-10-CM | POA: Diagnosis not present

## 2022-12-19 DIAGNOSIS — Z79899 Other long term (current) drug therapy: Secondary | ICD-10-CM | POA: Insufficient documentation

## 2022-12-19 DIAGNOSIS — E559 Vitamin D deficiency, unspecified: Secondary | ICD-10-CM | POA: Diagnosis not present

## 2022-12-19 LAB — CBC WITH DIFFERENTIAL/PLATELET
Abs Immature Granulocytes: 0.02 10*3/uL (ref 0.00–0.07)
Basophils Absolute: 0 10*3/uL (ref 0.0–0.1)
Basophils Relative: 0 %
Eosinophils Absolute: 0.1 10*3/uL (ref 0.0–0.5)
Eosinophils Relative: 2 %
HCT: 39.4 % (ref 36.0–46.0)
Hemoglobin: 13 g/dL (ref 12.0–15.0)
Immature Granulocytes: 0 %
Lymphocytes Relative: 19 %
Lymphs Abs: 1.3 10*3/uL (ref 0.7–4.0)
MCH: 30.1 pg (ref 26.0–34.0)
MCHC: 33 g/dL (ref 30.0–36.0)
MCV: 91.2 fL (ref 80.0–100.0)
Monocytes Absolute: 0.4 10*3/uL (ref 0.1–1.0)
Monocytes Relative: 6 %
Neutro Abs: 4.8 10*3/uL (ref 1.7–7.7)
Neutrophils Relative %: 73 %
Platelets: 265 10*3/uL (ref 150–400)
RBC: 4.32 MIL/uL (ref 3.87–5.11)
RDW: 12.6 % (ref 11.5–15.5)
WBC: 6.7 10*3/uL (ref 4.0–10.5)
nRBC: 0 % (ref 0.0–0.2)

## 2022-12-19 LAB — VITAMIN D 25 HYDROXY (VIT D DEFICIENCY, FRACTURES): Vit D, 25-Hydroxy: 40.63 ng/mL (ref 30–100)

## 2022-12-19 LAB — COMPREHENSIVE METABOLIC PANEL
ALT: 20 U/L (ref 0–44)
AST: 23 U/L (ref 15–41)
Albumin: 4.2 g/dL (ref 3.5–5.0)
Alkaline Phosphatase: 89 U/L (ref 38–126)
Anion gap: 8 (ref 5–15)
BUN: 16 mg/dL (ref 6–20)
CO2: 26 mmol/L (ref 22–32)
Calcium: 9.5 mg/dL (ref 8.9–10.3)
Chloride: 105 mmol/L (ref 98–111)
Creatinine, Ser: 0.67 mg/dL (ref 0.44–1.00)
GFR, Estimated: >60 mL/min (ref >60)
Glucose, Bld: 102 mg/dL — ABNORMAL HIGH (ref 70–99)
Potassium: 4 mmol/L (ref 3.5–5.1)
Sodium: 139 mmol/L (ref 135–145)
Total Bilirubin: 0.6 mg/dL (ref <1.2)
Total Protein: 7.5 g/dL (ref 6.5–8.1)

## 2022-12-19 NOTE — Patient Instructions (Addendum)
[facility] at [facility] Discharge Instructions   You were seen and examined today by Dr. Ellin Saba.  He reviewed the results of your lab work which are normal/stable.   Continue anastrozole as prescribed.   We will see you back in 6 months. We will repeat the lab work prior to this visit.    Thank you for choosing [facility] at [facility] to provide your oncology and hematology care.  To afford each patient quality time with our provider, please arrive at least 15 minutes before your scheduled appointment time.   If you have a lab appointment with the Cancer Center please come in thru the Main Entrance and check in at the main information desk.  You need to re-schedule your appointment should you arrive 10 or more minutes late.  We strive to give you quality time with our providers, and arriving late affects you and other patients whose appointments are after yours.  Also, if you no show three or more times for appointments you may be dismissed from the clinic at the providers discretion.     Again, thank you for choosing [facility].  Our hope is that these requests will decrease the amount of time that you wait before being seen by our physicians.       _____________________________________________________________  Should you have questions after your visit to [facility], please contact our office at [phone] and follow the prompts.  Our office hours are 8:00 a.m. and 4:30 p.m. Monday - Friday.  Please note that voicemails left after 4:00 p.m. may not be returned until the following business day.  We are closed weekends and major holidays.  You do have access to a nurse 24-7, just call the main number to the clinic [phone] and do not press any options, hold on the line and a nurse will answer the phone.    For prescription refill requests, have your pharmacy contact our office and allow 72 hours.    Due  to Covid, you will need to wear a mask upon entering the hospital. If you do not have a mask, a mask will be given to you at the Main Entrance upon arrival. For doctor visits, patients may have 1 support person age 37 or older with them. For treatment visits, patients can not have anyone with them due to social distancing guidelines and our immunocompromised population.

## 2022-12-19 NOTE — Progress Notes (Signed)
[facility] [address] [place], [place] [address]    Clinic Day:  12/19/2022  Referring physician: Joaquin Courts, DO  Patient Care Team: Joaquin Courts, DO as PCP - General (Family Medicine) Doreatha Massed, MD as Medical Oncologist (Medical Oncology) Therese Sarah, RN as Oncology Nurse Navigator (Medical Oncology)   ASSESSMENT & PLAN:   Assessment: 1. 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  She missed mammograms for few years prior to that. - Mammogram was done in [facility]. - Left breast biopsy on 01/10/2021: Invasive lobular carcinoma, intermediate grade, ER/PR 90% positive, Ki-67 5 to 10% positive, HER2 negative - Left breast lumpectomy and SLNB on 02/01/2021 by Dr. Eulah Citizen - Pathology: Multifocal infiltrating lobular carcinoma, largest focus measuring 3.5 cm, multiple additional nodules present in the specimen particularly in the medial and lateral margins, grade 2, invasive tumor at the inked medial margin, 2/2 positive axillary lymph nodes, Ki-67 variable (40-50% with a number of tumor nodules stain and examined), ER/PR positive, HER2 negative.  Additional anterior medial margin was negative.  No tumor seen at the left breast deep margin tissue. - Patient reportedly had genetic testing done and was told BRCA -7 years ago in [place]. - MRI of the breast on 03/30/2021: Mildly prominent left subpectoral lymph node measuring 8 mm.  Solitary prominent left axillary lymph node.  5 mm mass in the upper inner quadrant of the right breast. - Right breast biopsy: Pseudoangiomatous stromal hyperplasia, blunt duct adenosis, no malignancy identified.  Left breast intramammary lymph node shows nodal tissue with pigmented histiocytes.  No malignancy. - Oncotype DX: [id number].  Distal recurrence risk at 9 years on tamoxifen was 21%. - PET scan  on 04/20/2021: Postsurgical changes in both breasts without metabolic activity for residual tumor.  Small focus of faint hypermetabolic activity within the left anterior chest wall involving the pectoralis muscle or subpectoral space without corresponding CT finding.  No evidence of distant metastatic disease. - Dose dense AC for 4 cycles from 05/01/2021 through 06/12/2021, weekly paclitaxel started from 06/27/2021 through 09/12/2021.  XRT to the left breast and chest wall completed on 12/10/2021.  Anastrozole started on 09/12/2021   2. Social/family history: - She lives in [place] with her husband.  She works as a Comptroller.  She is a non-smoker. - Mother had breast cancer at age 37.  Paternal grandmother had thyroid cancer.    Plan: 1. Stage II T2N1 invasive lobular carcinoma of left breast: - She had right breast reduction surgery done and recovered well. - She is tolerating anastrozole very well.  Hot flashes has improved.  Mild tingling in the fingertips is stable.  No significant arthralgias or myalgias noted. - She has not started menstruation yet.  She is inquiring about birth control methods.  I have recommended that she use nonhormonal IUD. - Physical exam: Right breast reduction incisions well-healed.  Left breast upper outer quadrant lumpectomy scar is stable with some scar thickness towards the areola.  No palpable adenopathy. - She will have mammogram done on 01/15/2023 in [place]. - Continue anastrozole.  RTC 6 months for follow-up.  2.  Anxiety/depression: - Continue Prozac 60 mg daily.  She is not taking BuSpar at this time.   3.  Bone health: - She had DEXA scan in [place].  Will obtain results of it. - She is taking calcium and vitamin  D.  Last vitamin D level is 32.  Will check at next visit.    Orders Placed This Encounter  Procedures   CBC with Differential    Standing Status:   Future    Expected Date:   06/12/2023    Expiration Date:   12/19/2023    Comprehensive metabolic panel    Standing Status:   Future    Expected Date:   06/12/2023    Expiration Date:   12/19/2023   VITAMIN D 25 Hydroxy (Vit-D Deficiency, Fractures)    Standing Status:   Future    Expected Date:   06/12/2023    Expiration Date:   12/19/2023      Mikeal Hawthorne R Teague,acting as a scribe for Doreatha Massed, MD.,have documented all relevant documentation on the behalf of Doreatha Massed, MD,as directed by  Doreatha Massed, MD while in the presence of Doreatha Massed, MD.  I, Doreatha Massed MD, have reviewed the above documentation for accuracy and completeness, and I agree with the above.    Doreatha Massed, MD   12/12/20243:07 PM  CHIEF COMPLAINT:   Diagnosis: left breast invasive lobular carcinoma    Cancer Staging  Invasive lobular carcinoma of left breast in female [facility]) Staging form: Breast, AJCC 8th Edition - Clinical stage from 04/11/2021: Stage IIA (cT2, cN1, cM0, G2, ER+, PR+, HER2-) - Unsigned    Prior Therapy: 1. Left breast lumpectomy and SLNB on 02/01/2021 by Dr. Eulah Citizen  2. Adjuvant ACT, 4 cycles 05/01/21 - 06/12/21 3. Weekly paclitaxel, 06/27/21 - 09/12/21 4. XRT to left breast, completed 12/10/21  Current Therapy:  anastrozole   HISTORY OF PRESENT ILLNESS:   Oncology History  Invasive lobular carcinoma of left breast in female [facility])  04/11/2021 Initial Diagnosis   Invasive lobular carcinoma of left breast in female [facility])   05/01/2021 -  Chemotherapy   Patient is on Treatment Plan : BREAST ADJUVANT DOSE DENSE AC q14d / PACLitaxel q7d        INTERVAL HISTORY:   Debra Vaughn is a 47 y.o. female presenting to clinic today for follow up of left breast invasive lobular carcinoma. She was last seen by me on 06/19/22.  Since her last visit, she underwent right breast reduction on 10/18/22 with biopsy. Pathology reports revealed: benign skin and breast tissue with no significant histologic alteration.   Today, she states that she is  doing well overall. Her appetite level is at 100%. Her energy level is at 75%.  She has a mammogram scheduled for 01/15/23 and a follow-up with Dr. Eulah Citizen after. She reports on and off numbness in the fingertips that are occasionally painful. Symptoms have gradually improved. She is taking Calcium, Vitamin D 1200 units, and Prozac as prescribed. She states she had a bone density test around the time of her last visit. She denies any side effects with anastrozole, including aches and pains. Hot flashes have resolved. She has not had any menstruation or spotting.   PAST MEDICAL HISTORY:   Past Medical History: No past medical history on file.  Surgical History: Past Surgical History:  Procedure Laterality Date   BREAST BIOPSY Right 04/06/2021    Social History: Social History   Socioeconomic History   Marital status: Married    Spouse name: Not on file   Number of children: Not on file   Years of education: Not on file   Highest education level: Not on file  Occupational History   Not on file  Tobacco Use   Smoking status:  Never   Smokeless tobacco: Never  Vaping Use   Vaping status: Never Used  Substance and Sexual Activity   Alcohol use: Not Currently    Comment: occasionally   Drug use: Never   Sexual activity: Not Currently  Other Topics Concern   Not on file  Social History Narrative   Not on file   Social Drivers of Health   Financial Resource Strain: Not on file  Food Insecurity: Not on file  Transportation Needs: Not on file  Physical Activity: Insufficiently Active (11/03/2018)   [facility] from [facility], [facility]   Exercise Vital Sign    Days of Exercise per Week: 5 days    Minutes of Exercise per Session: 20 min  Stress: Not on file  Social Connections: Unknown (10/18/2022)   Received from [facility]   Social Network    Social Network: Not on file  Intimate Partner Violence: Not At Risk (10/18/2022)   Received from [facility]   HITS     Over the last 12 months how often did your partner physically hurt you?: Never    Over the last 12 months how often did your partner insult you or talk down to you?: Never    Over the last 12 months how often did your partner threaten you with physical harm?: Never    Over the last 12 months how often did your partner scream or curse at you?: Never    Family History: No family history on file.  Current Medications:  Current Outpatient Medications:    anastrozole (ARIMIDEX) 1 MG tablet, TAKE 1 TABLET BY MOUTH EVERY DAY, Disp: 90 tablet, Rfl: 3   busPIRone (BUSPAR) 7.5 MG tablet, Take 7.5 mg by mouth 3 (three) times daily., Disp: , Rfl:    FLUoxetine (PROZAC) 20 MG capsule, Take 60 mg by mouth every morning., Disp: , Rfl:  No current facility-administered medications for this visit.  Facility-Administered Medications Ordered in Other Visits:    diphenhydrAMINE (BENADRYL) 50 MG/ML injection, , , ,    diphenhydrAMINE (BENADRYL) 50 MG/ML injection, , , ,    famotidine (PEPCID) 20-0.9 MG/50ML-% IVPB, , , ,    famotidine (PEPCID) 20-0.9 MG/50ML-% IVPB, , , ,    lanreotide acetate (SOMATULINE DEPOT) 120 MG/0.5ML injection, , , ,    octreotide (SANDOSTATIN LAR) 30 MG IM injection, , , ,    palonosetron (ALOXI) 0.25 MG/5ML injection, , , ,    sodium chloride flush (NS) 0.9 % injection 10 mL, 10 mL, Intravenous, PRN, Doreatha Massed, MD, 10 mL at 05/08/21 1235   Allergies: Allergies  Allergen Reactions   Bupropion Hives    Other reaction(s): seizure    REVIEW OF SYSTEMS:   Review of Systems  Constitutional:  Negative for chills, fatigue and fever.  HENT:   Negative for lump/mass, mouth sores, nosebleeds, sore throat and trouble swallowing.   Eyes:  Negative for eye problems.  Respiratory:  Negative for cough and shortness of breath.   Cardiovascular:  Negative for chest pain, leg swelling and palpitations.  Gastrointestinal:  Negative for abdominal pain, constipation, diarrhea,  nausea and vomiting.  Genitourinary:  Negative for bladder incontinence, difficulty urinating, dysuria, frequency, hematuria and nocturia.   Musculoskeletal:  Negative for arthralgias, back pain, flank pain, myalgias and neck pain.  Skin:  Negative for itching and rash.  Neurological:  Positive for headaches and numbness (in fingertips). Negative for dizziness.  Hematological:  Does not bruise/bleed easily.  Psychiatric/Behavioral:  Negative for  depression, sleep disturbance and suicidal ideas. The patient is not nervous/anxious.   All other systems reviewed and are negative.    VITALS:   Blood pressure 123/79, pulse 92, temperature 98.3 F (36.8 C), temperature source Tympanic, resp. rate 18, weight 151 lb 3.2 oz (68.6 kg), SpO2 99%.  Wt Readings from Last 3 Encounters:  12/19/22 151 lb 3.2 oz (68.6 kg)  06/19/22 161 lb 4.8 oz (73.2 kg)  06/07/22 163 lb (73.9 kg)    Body mass index is 23.68 kg/m.  Performance status (ECOG): 1 - Symptomatic but completely ambulatory  PHYSICAL EXAM:   Physical Exam Vitals and nursing note reviewed. Exam conducted with a chaperone present.  Constitutional:      Appearance: Normal appearance.  Cardiovascular:     Rate and Rhythm: Normal rate and regular rhythm.     Pulses: Normal pulses.     Heart sounds: Normal heart sounds.  Pulmonary:     Effort: Pulmonary effort is normal.     Breath sounds: Normal breath sounds.  Chest:     Comments: +scars on right breast are well healed +left breast upper outer quadrant lumpectomy scar is WNL Abdominal:     Palpations: Abdomen is soft. There is no hepatomegaly, splenomegaly or mass.     Tenderness: There is no abdominal tenderness.  Musculoskeletal:     Right lower leg: No edema.     Left lower leg: No edema.  Lymphadenopathy:     Cervical: No cervical adenopathy.     Right cervical: No superficial, deep or posterior cervical adenopathy.    Left cervical: No superficial, deep or posterior cervical  adenopathy.     Upper Body:     Right upper body: No supraclavicular or axillary adenopathy.     Left upper body: No supraclavicular or axillary adenopathy.  Neurological:     General: No focal deficit present.     Mental Status: She is alert and oriented to person, place, and time.  Psychiatric:        Mood and Affect: Mood normal.        Behavior: Behavior normal.   Breast Exam Chaperone: Chapman Moss, RN   LABS:      Latest Ref Rng & Units 12/19/2022    2:01 PM 06/19/2022    2:02 PM 12/05/2021   12:30 PM  CBC  WBC 4.0 - 10.5 K/uL 6.7  7.6  6.0   Hemoglobin 12.0 - 15.0 g/dL 32.9  51.8  84.1   Hematocrit 36.0 - 46.0 % 39.4  38.1  36.8   Platelets 150 - 400 K/uL 265  272  260       Latest Ref Rng & Units 12/19/2022    2:01 PM 06/19/2022    2:02 PM 12/05/2021   12:30 PM  CMP  Glucose 70 - 99 mg/dL 660  630  88   BUN 6 - 20 mg/dL 16  17  12    Creatinine 0.44 - 1.00 mg/dL 1.60  1.09  3.23   Sodium 135 - 145 mmol/L 139  138  138   Potassium 3.5 - 5.1 mmol/L 4.0  4.4  4.0   Chloride 98 - 111 mmol/L 105  102  105   CO2 22 - 32 mmol/L 26  25  25    Calcium 8.9 - 10.3 mg/dL 9.5  9.5  9.4   Total Protein 6.5 - 8.1 g/dL 7.5  7.6  7.4   Total Bilirubin <1.2 mg/dL 0.6  0.6  0.5  Alkaline Phos 38 - 126 U/L 89  90  86   AST 15 - 41 U/L 23  22  42   ALT 0 - 44 U/L 20  18  45      No results found for: "CEA1", "CEA" / No results found for: "CEA1", "CEA" No results found for: "PSA1" No results found for: "EXB284" No results found for: "CAN125"  No results found for: "TOTALPROTELP", "ALBUMINELP", "A1GS", "A2GS", "BETS", "BETA2SER", "GAMS", "MSPIKE", "SPEI" No results found for: "TIBC", "FERRITIN", "IRONPCTSAT" No results found for: "LDH"   STUDIES:   No results found.

## 2022-12-21 ENCOUNTER — Other Ambulatory Visit: Payer: Self-pay

## 2023-06-12 ENCOUNTER — Inpatient Hospital Stay: Payer: BC Managed Care – PPO | Attending: Hematology

## 2023-06-12 DIAGNOSIS — Z79811 Long term (current) use of aromatase inhibitors: Secondary | ICD-10-CM | POA: Insufficient documentation

## 2023-06-12 DIAGNOSIS — Z170 Estrogen receptor positive status [ER+]: Secondary | ICD-10-CM | POA: Insufficient documentation

## 2023-06-12 DIAGNOSIS — M8580 Other specified disorders of bone density and structure, unspecified site: Secondary | ICD-10-CM | POA: Diagnosis not present

## 2023-06-12 DIAGNOSIS — E559 Vitamin D deficiency, unspecified: Secondary | ICD-10-CM

## 2023-06-12 DIAGNOSIS — C50912 Malignant neoplasm of unspecified site of left female breast: Secondary | ICD-10-CM | POA: Insufficient documentation

## 2023-06-12 LAB — COMPREHENSIVE METABOLIC PANEL WITH GFR
ALT: 44 U/L (ref 0–44)
AST: 41 U/L (ref 15–41)
Albumin: 3.7 g/dL (ref 3.5–5.0)
Alkaline Phosphatase: 91 U/L (ref 38–126)
Anion gap: 9 (ref 5–15)
BUN: 17 mg/dL (ref 6–20)
CO2: 27 mmol/L (ref 22–32)
Calcium: 9.5 mg/dL (ref 8.9–10.3)
Chloride: 106 mmol/L (ref 98–111)
Creatinine, Ser: 0.79 mg/dL (ref 0.44–1.00)
GFR, Estimated: >60 mL/min (ref >60)
Glucose, Bld: 85 mg/dL (ref 70–99)
Potassium: 4.8 mmol/L (ref 3.5–5.1)
Sodium: 142 mmol/L (ref 135–145)
Total Bilirubin: 0.6 mg/dL (ref 0.0–1.2)
Total Protein: 7.1 g/dL (ref 6.5–8.1)

## 2023-06-12 LAB — CBC WITH DIFFERENTIAL/PLATELET
Abs Immature Granulocytes: 0.02 10*3/uL (ref 0.00–0.07)
Basophils Absolute: 0 10*3/uL (ref 0.0–0.1)
Basophils Relative: 1 %
Eosinophils Absolute: 0.2 10*3/uL (ref 0.0–0.5)
Eosinophils Relative: 2 %
HCT: 38.1 % (ref 36.0–46.0)
Hemoglobin: 12.6 g/dL (ref 12.0–15.0)
Immature Granulocytes: 0 %
Lymphocytes Relative: 18 %
Lymphs Abs: 1.4 10*3/uL (ref 0.7–4.0)
MCH: 30.1 pg (ref 26.0–34.0)
MCHC: 33.1 g/dL (ref 30.0–36.0)
MCV: 90.9 fL (ref 80.0–100.0)
Monocytes Absolute: 0.6 10*3/uL (ref 0.1–1.0)
Monocytes Relative: 7 %
Neutro Abs: 5.6 10*3/uL (ref 1.7–7.7)
Neutrophils Relative %: 72 %
Platelets: 267 10*3/uL (ref 150–400)
RBC: 4.19 MIL/uL (ref 3.87–5.11)
RDW: 12.7 % (ref 11.5–15.5)
WBC: 7.8 10*3/uL (ref 4.0–10.5)
nRBC: 0 % (ref 0.0–0.2)

## 2023-06-12 LAB — VITAMIN D 25 HYDROXY (VIT D DEFICIENCY, FRACTURES): Vit D, 25-Hydroxy: 47.18 ng/mL (ref 30–100)

## 2023-06-16 ENCOUNTER — Other Ambulatory Visit: Payer: Self-pay | Admitting: *Deleted

## 2023-06-19 ENCOUNTER — Inpatient Hospital Stay: Payer: BC Managed Care – PPO | Admitting: Hematology

## 2023-06-19 VITALS — BP 136/90 | HR 102 | Temp 97.5°F | Resp 18 | Wt 151.9 lb

## 2023-06-19 DIAGNOSIS — E559 Vitamin D deficiency, unspecified: Secondary | ICD-10-CM

## 2023-06-19 DIAGNOSIS — C50912 Malignant neoplasm of unspecified site of left female breast: Secondary | ICD-10-CM

## 2023-06-19 NOTE — Patient Instructions (Signed)

## 2023-06-19 NOTE — Progress Notes (Signed)
 [facility] [address], [place] [address]    Clinic Day:  06/19/2023  Referring physician: Favero, John Patrick, DO  Patient Care Team: Favero, John Patrick, DO as PCP - General (Family Medicine) Paulett Boros, MD as Medical Oncologist (Medical Oncology) Gerhard Knuckles, RN as Oncology Nurse Navigator (Medical Oncology)   ASSESSMENT & PLAN:   Assessment: 1. 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  She missed mammograms for few years prior to that. - Mammogram was done in [facility]. - Left breast biopsy on 01/10/2021: Invasive lobular carcinoma, intermediate grade, ER/PR 90% positive, Ki-67 5 to 10% positive, HER2 negative - Left breast lumpectomy and SLNB on 02/01/2021 by Dr. Madelene Schanz - Pathology: Multifocal infiltrating lobular carcinoma, largest focus measuring 3.5 cm, multiple additional nodules present in the specimen particularly in the medial and lateral margins, grade 2, invasive tumor at the inked medial margin, 2/2 positive axillary lymph nodes, Ki-67 variable (40-50% with a number of tumor nodules stain and examined), ER/PR positive, HER2 negative.  Additional anterior medial margin was negative.  No tumor seen at the left breast deep margin tissue. - Patient reportedly had genetic testing done and was told BRCA -7 years ago in [place]. - MRI of the breast on 03/30/2021: Mildly prominent left subpectoral lymph node measuring 8 mm.  Solitary prominent left axillary lymph node.  5 mm mass in the upper inner quadrant of the right breast. - Right breast biopsy: Pseudoangiomatous stromal hyperplasia, blunt duct adenosis, no malignancy identified.  Left breast intramammary lymph node shows nodal tissue with pigmented histiocytes.  No malignancy. - Oncotype DX: [id number].  Distal recurrence risk at 9 years on tamoxifen was 21%. - PET scan on  04/20/2021: Postsurgical changes in both breasts without metabolic activity for residual tumor.  Small focus of faint hypermetabolic activity within the left anterior chest wall involving the pectoralis muscle or subpectoral space without corresponding CT finding.  No evidence of distant metastatic disease. - Dose dense AC for 4 cycles from 05/01/2021 through 06/12/2021, weekly paclitaxel  started from 06/27/2021 through 09/12/2021.  XRT to the left breast and chest wall completed on 12/10/2021.  Anastrozole  started on 09/12/2021   2. Social/family history: - She lives in [place] with her husband.  She works as a Comptroller.  She is a non-smoker. - Mother had breast cancer at age 16.  Paternal grandmother had thyroid cancer.    Plan: 1. Stage II T2N1 invasive lobular carcinoma of left breast: - She is tolerating anastrozole  very well.  Hot flashes have improved.  Mild tingling in the fingertips is stable.  No musculoskeletal symptoms. - Breast exam was deferred.  She does not have any palpable supraclavicular or axillary adenopathy. - Reviewed labs from 06/12/2023: Normal LFTs.  CBC normal. - Continue anastrozole  daily for a total of at least 5 years.  During year 4, she may have BCI testing done to see if she benefits from continuing antiestrogen therapy beyond 5 years.  She will continue every 46-month follow-up until year 5 and then once a year after that.  She will continue mammograms done in [place].  She is also seeing her surgeon Dr. Madelene Schanz every 6 months.  2.  Anxiety/depression: - Continue Prozac daily.  Symptoms well-controlled.   3.  Osteopenia: - We reviewed DEXA scan from January 2024 done in [place] with T-score of -1.7. - Vitamin D   level is 47.  Continue calcium and vitamin D  supplements. - Recommend repeating DEXA scan in 2026.    Orders Placed This Encounter  Procedures   CBC with Differential    Standing Status:   Future    Expected Date:   12/15/2023    Expiration Date:    03/14/2024   Comprehensive metabolic panel    Standing Status:   Future    Expected Date:   12/15/2023    Expiration Date:   03/14/2024   VITAMIN D  25 Hydroxy (Vit-D Deficiency, Fractures)    Standing Status:   Future    Expected Date:   12/15/2023    Expiration Date:   03/14/2024      Debra Vaughn,acting as a scribe for Paulett Boros, MD.,have documented all relevant documentation on the behalf of Paulett Boros, MD,as directed by  Paulett Boros, MD while in the presence of Paulett Boros, MD.  I, Paulett Boros MD, have reviewed the above documentation for accuracy and completeness, and I agree with the above.     Paulett Boros, MD   6/12/20253:57 PM  CHIEF COMPLAINT:   Diagnosis: left breast invasive lobular carcinoma    Cancer Staging  Invasive lobular carcinoma of left breast in female [facility]) Staging form: Breast, AJCC 8th Edition - Clinical stage from 04/11/2021: Stage IIA (cT2, cN1, cM0, G2, ER+, PR+, HER2-) - Unsigned    Prior Therapy: 1. Left breast lumpectomy and SLNB on 02/01/2021 by Dr. Madelene Schanz  2. Adjuvant ACT, 4 cycles 05/01/21 - 06/12/21 3. Weekly paclitaxel , 06/27/21 - 09/12/21 4. XRT to left breast, completed 12/10/21  Current Therapy:  anastrozole    HISTORY OF PRESENT ILLNESS:   Oncology History  Invasive lobular carcinoma of left breast in female [facility])  04/11/2021 Initial Diagnosis   Invasive lobular carcinoma of left breast in female [facility])   05/01/2021 -  Chemotherapy   Patient is on Treatment Plan : BREAST ADJUVANT DOSE DENSE AC q14d / PACLitaxel  q7d        INTERVAL HISTORY:   Debra Vaughn is a 48 y.o. female presenting to clinic today for follow up of left breast invasive lobular carcinoma. She was last seen by me on 12/19/22.  Today, she states that she is doing well overall. Her appetite level is at 100%. Her energy level is at 80%. Debra Vaughn has follow-up with Dr. Madelene Schanz next month. She denies any side effects from Anastrozole ,  including hot flashes, aches, or pains. Debra Vaughn states she had a bone density scan in January 2024, that showed osteopenia. She also had a mammogram done in January 2025.   She is taking Vitamin D  supplements.   PAST MEDICAL HISTORY:   Past Medical History: No past medical history on file.  Surgical History: Past Surgical History:  Procedure Laterality Date   BREAST BIOPSY Right 04/06/2021    Social History: Social History   Socioeconomic History   Marital status: Married    Spouse name: Not on file   Number of children: Not on file   Years of education: Not on file   Highest education level: Not on file  Occupational History   Not on file  Tobacco Use   Smoking status: Never   Smokeless tobacco: Never  Vaping Use   Vaping status: Never Used  Substance and Sexual Activity   Alcohol use: Not Currently    Comment: occasionally   Drug use: Never   Sexual activity: Not Currently  Other Topics Concern   Not on file  Social History  Narrative   Not on file   Social Drivers of Health   Financial Resource Strain: Not on file  Food Insecurity: Not on file  Transportation Needs: Not on file  Physical Activity: Insufficiently Active (11/03/2018)   Received from [facility]   Exercise Vital Sign    On average, how many days per week do you engage in moderate to strenuous exercise (like a brisk walk)?: 5 days    On average, how many minutes do you engage in exercise at this level?: 20 min  Stress: Not on file  Social Connections: Unknown (10/18/2022)   Received from [facility]   Social Network    Social Network: Not on file  Intimate Partner Violence: Not At Risk (10/18/2022)   Received from [facility]   HITS    Over the last 12 months how often did your partner physically hurt you?: Never    Over the last 12 months how often did your partner insult you or talk down to you?: Never    Over the last 12 months how often did your partner threaten you with physical  harm?: Never    Over the last 12 months how often did your partner scream or curse at you?: Never    Family History: No family history on file.  Current Medications:  Current Outpatient Medications:    anastrozole  (ARIMIDEX ) 1 MG tablet, TAKE 1 TABLET BY MOUTH EVERY DAY, Disp: 90 tablet, Rfl: 3   Calcium Carb-Cholecalciferol 600-3.125 MG-MCG TABS, Take 1 tablet every 12 hours by oral route., Disp: , Rfl:    FLUoxetine (PROZAC) 20 MG capsule, Take 60 mg by mouth every morning., Disp: , Rfl:    rosuvastatin (CRESTOR) 5 MG tablet, Take 1 tablet every day by oral route for 100 days., Disp: , Rfl:  No current facility-administered medications for this visit.  Facility-Administered Medications Ordered in Other Visits:    diphenhydrAMINE  (BENADRYL ) 50 MG/ML injection, , , ,    diphenhydrAMINE  (BENADRYL ) 50 MG/ML injection, , , ,    famotidine  (PEPCID ) 20-0.9 MG/50ML-% IVPB, , , ,    famotidine  (PEPCID ) 20-0.9 MG/50ML-% IVPB, , , ,    lanreotide acetate  (SOMATULINE DEPOT ) 120 MG/0.5ML injection, , , ,    octreotide  (SANDOSTATIN  LAR) 30 MG IM injection, , , ,    palonosetron  (ALOXI ) 0.25 MG/5ML injection, , , ,    sodium chloride  flush (NS) 0.9 % injection 10 mL, 10 mL, Intravenous, PRN, Kanika Bungert, MD, 10 mL at 05/08/21 1235   Allergies: Allergies  Allergen Reactions   Bupropion Hives    Other reaction(s): seizure    REVIEW OF SYSTEMS:   Review of Systems  Constitutional:  Negative for chills, fatigue and fever.  HENT:   Negative for lump/mass, mouth sores, nosebleeds, sore throat and trouble swallowing.   Eyes:  Negative for eye problems.  Respiratory:  Negative for cough and shortness of breath.   Cardiovascular:  Negative for chest pain, leg swelling and palpitations.  Gastrointestinal:  Negative for abdominal pain, constipation, diarrhea, nausea and vomiting.  Genitourinary:  Negative for bladder incontinence, difficulty urinating, dysuria, frequency, hematuria and  nocturia.   Musculoskeletal:  Negative for arthralgias, back pain, flank pain, myalgias and neck pain.  Skin:  Negative for itching and rash.  Neurological:  Negative for dizziness, headaches and numbness.  Hematological:  Does not bruise/bleed easily.  Psychiatric/Behavioral:  Negative for depression, sleep disturbance and suicidal ideas. The patient is not nervous/anxious.   All other systems reviewed  and are negative.    VITALS:   Blood pressure (!) 136/90, pulse (!) 102, temperature (!) 97.5 F (36.4 C), temperature source Tympanic, resp. rate 18, weight 151 lb 14.4 oz (68.9 kg), SpO2 97%.  Wt Readings from Last 3 Encounters:  06/19/23 151 lb 14.4 oz (68.9 kg)  12/19/22 151 lb 3.2 oz (68.6 kg)  06/19/22 161 lb 4.8 oz (73.2 kg)    Body mass index is 23.79 kg/m.  Performance status (ECOG): 1 - Symptomatic but completely ambulatory  PHYSICAL EXAM:   Physical Exam Vitals and nursing note reviewed. Exam conducted with a chaperone present.  Constitutional:      Appearance: Normal appearance.   Cardiovascular:     Rate and Rhythm: Normal rate and regular rhythm.     Pulses: Normal pulses.     Heart sounds: Normal heart sounds.  Pulmonary:     Effort: Pulmonary effort is normal.     Breath sounds: Normal breath sounds.  Abdominal:     Palpations: Abdomen is soft. There is no hepatomegaly, splenomegaly or mass.     Tenderness: There is no abdominal tenderness.   Musculoskeletal:     Right lower leg: No edema.     Left lower leg: No edema.  Lymphadenopathy:     Cervical: No cervical adenopathy.     Right cervical: No superficial, deep or posterior cervical adenopathy.    Left cervical: No superficial, deep or posterior cervical adenopathy.     Upper Body:     Right upper body: No supraclavicular or axillary adenopathy.     Left upper body: No supraclavicular or axillary adenopathy.   Neurological:     General: No focal deficit present.     Mental Status: She is alert  and oriented to person, place, and time.   Psychiatric:        Mood and Affect: Mood normal.        Behavior: Behavior normal.     LABS:      Latest Ref Rng & Units 06/12/2023    2:30 PM 12/19/2022    2:01 PM 06/19/2022    2:02 PM  CBC  WBC 4.0 - 10.5 K/uL 7.8  6.7  7.6   Hemoglobin 12.0 - 15.0 g/dL 08.6  57.8  46.9   Hematocrit 36.0 - 46.0 % 38.1  39.4  38.1   Platelets 150 - 400 K/uL 267  265  272       Latest Ref Rng & Units 06/12/2023    2:30 PM 12/19/2022    2:01 PM 06/19/2022    2:02 PM  CMP  Glucose 70 - 99 mg/dL 85  629  528   BUN 6 - 20 mg/dL 17  16  17    Creatinine 0.44 - 1.00 mg/dL 4.13  2.44  0.10   Sodium 135 - 145 mmol/L 142  139  138   Potassium 3.5 - 5.1 mmol/L 4.8  4.0  4.4   Chloride 98 - 111 mmol/L 106  105  102   CO2 22 - 32 mmol/L 27  26  25    Calcium 8.9 - 10.3 mg/dL 9.5  9.5  9.5   Total Protein 6.5 - 8.1 g/dL 7.1  7.5  7.6   Total Bilirubin 0.0 - 1.2 mg/dL 0.6  0.6  0.6   Alkaline Phos 38 - 126 U/L 91  89  90   AST 15 - 41 U/L 41  23  22   ALT 0 - 44 U/L 44  20  18      No results found for: CEA1, CEA / No results found for: CEA1, CEA No results found for: PSA1 No results found for: ZOX096 No results found for: CAN125  No results found for: TOTALPROTELP, ALBUMINELP, A1GS, A2GS, BETS, BETA2SER, GAMS, MSPIKE, SPEI No results found for: TIBC, FERRITIN, IRONPCTSAT No results found for: LDH   STUDIES:   No results found.

## 2023-06-20 ENCOUNTER — Other Ambulatory Visit: Payer: Self-pay

## 2023-08-19 ENCOUNTER — Other Ambulatory Visit: Payer: Self-pay | Admitting: *Deleted

## 2023-08-20 ENCOUNTER — Other Ambulatory Visit: Payer: Self-pay

## 2023-09-22 ENCOUNTER — Other Ambulatory Visit: Payer: Self-pay | Admitting: *Deleted

## 2023-09-22 MED ORDER — ANASTROZOLE 1 MG PO TABS: 1.0000 mg | ORAL_TABLET | Freq: Every day | ORAL | 3 refills | Status: AC

## 2023-12-10 ENCOUNTER — Inpatient Hospital Stay: Attending: Physician Assistant

## 2023-12-10 DIAGNOSIS — Z1732 Human epidermal growth factor receptor 2 negative status: Secondary | ICD-10-CM | POA: Insufficient documentation

## 2023-12-10 DIAGNOSIS — C50912 Malignant neoplasm of unspecified site of left female breast: Secondary | ICD-10-CM | POA: Insufficient documentation

## 2023-12-10 DIAGNOSIS — Z1721 Progesterone receptor positive status: Secondary | ICD-10-CM | POA: Diagnosis not present

## 2023-12-10 DIAGNOSIS — M8580 Other specified disorders of bone density and structure, unspecified site: Secondary | ICD-10-CM | POA: Diagnosis not present

## 2023-12-10 DIAGNOSIS — Z79811 Long term (current) use of aromatase inhibitors: Secondary | ICD-10-CM | POA: Insufficient documentation

## 2023-12-10 DIAGNOSIS — Z79899 Other long term (current) drug therapy: Secondary | ICD-10-CM | POA: Insufficient documentation

## 2023-12-10 DIAGNOSIS — Z170 Estrogen receptor positive status [ER+]: Secondary | ICD-10-CM | POA: Diagnosis not present

## 2023-12-10 DIAGNOSIS — E559 Vitamin D deficiency, unspecified: Secondary | ICD-10-CM

## 2023-12-10 LAB — CBC WITH DIFFERENTIAL/PLATELET
Abs Immature Granulocytes: 0.05 K/uL (ref 0.00–0.07)
Basophils Absolute: 0 K/uL (ref 0.0–0.1)
Basophils Relative: 0 %
Eosinophils Absolute: 0.2 K/uL (ref 0.0–0.5)
Eosinophils Relative: 2 %
HCT: 41.9 % (ref 36.0–46.0)
Hemoglobin: 13.5 g/dL (ref 12.0–15.0)
Immature Granulocytes: 1 %
Lymphocytes Relative: 16 %
Lymphs Abs: 1.5 K/uL (ref 0.7–4.0)
MCH: 29.5 pg (ref 26.0–34.0)
MCHC: 32.2 g/dL (ref 30.0–36.0)
MCV: 91.5 fL (ref 80.0–100.0)
Monocytes Absolute: 0.7 K/uL (ref 0.1–1.0)
Monocytes Relative: 8 %
Neutro Abs: 7 K/uL (ref 1.7–7.7)
Neutrophils Relative %: 73 %
Platelets: 280 K/uL (ref 150–400)
RBC: 4.58 MIL/uL (ref 3.87–5.11)
RDW: 12.6 % (ref 11.5–15.5)
WBC: 9.5 K/uL (ref 4.0–10.5)
nRBC: 0 % (ref 0.0–0.2)

## 2023-12-10 LAB — COMPREHENSIVE METABOLIC PANEL WITH GFR
ALT: 28 U/L (ref 0–44)
AST: 31 U/L (ref 15–41)
Albumin: 4.4 g/dL (ref 3.5–5.0)
Alkaline Phosphatase: 112 U/L (ref 38–126)
Anion gap: 9 (ref 5–15)
BUN: 12 mg/dL (ref 6–20)
CO2: 28 mmol/L (ref 22–32)
Calcium: 9.9 mg/dL (ref 8.9–10.3)
Chloride: 103 mmol/L (ref 98–111)
Creatinine, Ser: 0.76 mg/dL (ref 0.44–1.00)
GFR, Estimated: >60 mL/min (ref >60)
Glucose, Bld: 67 mg/dL — ABNORMAL LOW (ref 70–99)
Potassium: 4.5 mmol/L (ref 3.5–5.1)
Sodium: 140 mmol/L (ref 135–145)
Total Bilirubin: 0.4 mg/dL (ref 0.0–1.2)
Total Protein: 7.5 g/dL (ref 6.5–8.1)

## 2023-12-10 LAB — VITAMIN D 25 HYDROXY (VIT D DEFICIENCY, FRACTURES): Vit D, 25-Hydroxy: 56.77 ng/mL (ref 30–100)

## 2023-12-16 NOTE — Progress Notes (Unsigned)
 [facility] [address], [place] [address]   CLINIC:  Medical Oncology/Hematology  PCP:  Levander Norleen Dover, DO [address] / [place] [address] [phone]   REASON FOR VISIT:  Follow-up for stage II LEFT breast cancer (ER/PR positive)  PRIOR THERAPY: - Left breast lumpectomy and SLNB on 02/01/2021 by Dr. Claybon Sydell, [place]) - Adjuvant AC-T x 4 cycles (05/01/2021 through 06/12/2021) - Weekly paclitaxel  (06/27/2021 through 09/12/2021) - XRT to left breast, completed 12/10/2021  CURRENT THERAPY: Anastrozole  (since 09/12/2021)  BRIEF ONCOLOGIC HISTORY:   Oncology History  Invasive lobular carcinoma of left breast in female [facility])  04/11/2021 Initial Diagnosis   Invasive lobular carcinoma of left breast in female [facility])   05/01/2021 - 09/12/2021 Chemotherapy   Patient is on Treatment Plan : BREAST ADJUVANT DOSE DENSE AC q14d / PACLitaxel  q7d       CANCER STAGING: Cancer Staging  Invasive lobular carcinoma of left breast in female [facility]) Staging form: Breast, AJCC 8th Edition - Clinical stage from 04/11/2021: Stage IIA (cT2, cN1, cM0, G2, ER+, PR+, HER2-) - Unsigned   INTERVAL HISTORY:   [name], a 48 y.o. female, returns for routine follow-up of her left breast invasive lobular carcinoma.  Sharina was last seen on 06/19/2023 by Dr. Rogers.   In the interim since last visit, she has not had any surgeries, hospitalizations, or changes in baseline health status.  *** At today's visit, she reports feeling ***.  She  reports ***% energy and ***% appetite.   ***She  is maintaining stable weight at this time.  ***No new onset breast lumps or axillary lymphadenopathy. ***Denies any new aches or pains, headaches, or abdominal pain.  She continues to take anastrozole  daily.*** ***She is tolerating anastrozole  very well. *** Hot flashes have improved.  Mild tingling in the fingertips is stable.  No musculoskeletal symptoms.  ***She takes calcium  and vitamin D  daily.   ASSESSMENT & PLAN:  1.  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  She missed mammograms for few years prior to that. - Mammogram was done in [place] [facility]. - Left breast biopsy on 01/10/2021: Invasive lobular carcinoma, intermediate grade, ER/PR 90% positive, Ki-67 5 to 10% positive, HER2 negative - Left breast lumpectomy and SLNB on 02/01/2021 by Dr. Hamdy - Pathology: Multifocal infiltrating lobular carcinoma, largest focus measuring 3.5 cm, multiple additional nodules present in the specimen particularly in the medial and lateral margins, grade 2, invasive tumor at the inked medial margin, 2/2 positive axillary lymph nodes, Ki-67 variable (40-50% with a number of tumor nodules stain and examined), ER/PR positive, HER2 negative.  Additional anterior medial margin was negative.  No tumor seen at the left breast deep margin tissue. - Patient reportedly had genetic testing done and was told BRCA -7 years ago in [place]. - MRI of the breast on 03/30/2021: Mildly prominent left subpectoral lymph node measuring 8 mm.  Solitary prominent left axillary lymph node.  5 mm mass in the upper inner quadrant of the right breast. - Right breast biopsy: Pseudoangiomatous stromal hyperplasia, blunt duct adenosis, no malignancy identified.  Left breast intramammary lymph node shows nodal tissue with pigmented histiocytes.  No malignancy. - Oncotype DX: RS-26.  Distal recurrence risk at 9 years on tamoxifen was 21%. - PET scan on 04/20/2021: Postsurgical changes in both breasts without metabolic activity for residual tumor.  Small focus of faint hypermetabolic activity within the left anterior chest wall involving the pectoralis muscle or subpectoral space without corresponding CT finding.  No evidence of distant metastatic disease. - Dose dense AC for 4 cycles from 05/01/2021  through 06/12/2021, weekly paclitaxel  started from 06/27/2021 through 09/12/2021.  XRT to the left breast and chest wall completed on 12/10/2021.  Anastrozole  started on 09/12/2021  - - - - - - - - - - - - - - - - - - - - - - - - - - - - - - - - - -  - - - - - - - - - - - - - - - - - -  - She is tolerating anastrozole  very well. *** Hot flashes have improved.  Mild tingling in the fingertips is stable.  No musculoskeletal symptoms. - Breast exam was deferred.***  She does not have any palpable supraclavicular or axillary adenopathy.*** - Most recent mammogram (01/15/2023) at [facility]: BI-RADS Category 2, benign. - Most recent labs (12/10/2023): Normal CBC and CMP. - No red flag symptoms during today's visit.*** - PLAN: *** Continue anastrozole  daily.  (We will check BCI testing in year 4, around September 2027) - Continue annual breast exam via ***.  (She is also seeing her surgeon Dr. Claybon in [place] every 6 months). - Continue annual mammograms (done in [place], next due January 2026) *** - Continue office visits and labs every 6 months for total of 5 years.  At year 5, we will either switch to annual visits or discharge from clinic, depending on whether or not her antiestrogen treatment is extended.  ***  2.  Osteopenia - DEXA scan from January 2024 done in [place] showed T-score -1.7 - She is taking calcium and vitamin D  supplements daily. - Most recent labs (12/10/2023): Normal vitamin D  56.77, normal calcium 9.9. - PLAN: Repeat bone density/DEXA due January 2026.  *** Here versus [place]?  *** - Continue calcium, vitamin D , weightbearing exercises.  PLAN SUMMARY: >> *** >> *** >> ***   REVIEW OF SYSTEMS: ***  Review of Systems - Oncology  PHYSICAL EXAM:   Performance status (ECOG): {CHL ONC ED:8845999799} *** There were no vitals filed for this visit. Wt Readings from Last 3 Encounters:  06/19/23 151 lb 14.4 oz (68.9 kg)  12/19/22 151 lb 3.2 oz (68.6 kg)  06/19/22 161 lb  4.8 oz (73.2 kg)   Physical Exam   PAST MEDICAL/SURGICAL HISTORY:  No past medical history on file. Past Surgical History:  Procedure Laterality Date   BREAST BIOPSY Right 04/06/2021    SOCIAL HISTORY:  Social History   Socioeconomic History   Marital status: Married    Spouse name: Not on file   Number of children: Not on file   Years of education: Not on file   Highest education level: Not on file  Occupational History   Not on file  Tobacco Use   Smoking status: Never   Smokeless tobacco: Never  Vaping Use   Vaping status: Never Used  Substance and Sexual Activity   Alcohol use: Not Currently    Comment: occasionally   Drug use: Never   Sexual activity: Not Currently  Other Topics Concern   Not on file  Social History Narrative   Not on file   Social Drivers of Health   Financial Resource Strain: Not on file  Food Insecurity: No Food Insecurity (06/25/2023)   Received from [facility]   Hunger Vital Sign  Within the past 12 months, you worried that your food would run out before you got the money to buy more.: Never true    Within the past 12 months, the food you bought just didn't last and you didn't have money to get more.: Never true  Transportation Needs: No Transportation Needs (06/25/2023)   Received from [facility]   PRAPARE - Transportation    Lack of Transportation (Medical): No    Lack of Transportation (Non-Medical): No  Physical Activity: Insufficiently Active (11/03/2018)   Received from [facility]   Exercise Vital Sign    On average, how many days per week do you engage in moderate to strenuous exercise (like a brisk walk)?: 5 days    On average, how many minutes do you engage in exercise at this level?: 20 min  Stress: Not on file  Social Connections: Unknown (07/23/2022)   Received from [facility]   Social Network    Social Network: Not on file  Intimate Partner Violence: Not At Risk (10/18/2022)   Received from [facility]   HITS    Over the last 12 months how often did your partner physically hurt you?: Never    Over the last 12 months how often did your partner insult you or talk down to you?: Never    Over the last 12 months how often did your partner threaten you with physical harm?: Never    Over the last 12 months how often did your partner scream or curse at you?: Never    FAMILY HISTORY:  No family history on file.  CURRENT MEDICATIONS:  Current Outpatient Medications  Medication Sig Dispense Refill   anastrozole  (ARIMIDEX ) 1 MG tablet Take 1 tablet (1 mg total) by mouth daily. 90 tablet 3   Calcium Carb-Cholecalciferol 600-3.125 MG-MCG TABS Take 1 tablet every 12 hours by oral route.     FLUoxetine (PROZAC) 20 MG capsule Take 60 mg by mouth every morning.     rosuvastatin (CRESTOR) 5 MG tablet Take 1 tablet every day by oral route for 100 days.     No current facility-administered medications for this visit.   Facility-Administered Medications Ordered in Other Visits  Medication Dose Route Frequency Provider Last Rate Last Admin   diphenhydrAMINE  (BENADRYL ) 50 MG/ML injection            diphenhydrAMINE  (BENADRYL ) 50 MG/ML injection            famotidine  (PEPCID ) 20-0.9 MG/50ML-% IVPB            famotidine  (PEPCID ) 20-0.9 MG/50ML-% IVPB            lanreotide acetate  (SOMATULINE DEPOT ) 120 MG/0.5ML injection            octreotide  (SANDOSTATIN  LAR) 30 MG IM injection            palonosetron  (ALOXI ) 0.25 MG/5ML injection            sodium chloride  flush (NS) 0.9 % injection 10 mL  10 mL Intravenous PRN Rogers Hai, MD   10 mL at 05/08/21 1235    ALLERGIES:  Allergies  Allergen Reactions   Bupropion Hives    Other reaction(s): seizure    LABORATORY DATA:  I have reviewed the labs as listed.     Latest Ref Rng & Units 12/10/2023    2:58 PM 06/12/2023    2:30 PM 12/19/2022    2:01 PM  CBC  WBC 4.0 - 10.5 K/uL 9.5  7.8  6.7   Hemoglobin 12.0 - 15.0 g/dL 86.4  87.3  86.9    Hematocrit 36.0 - 46.0 % 41.9  38.1  39.4   Platelets 150 - 400 K/uL 280  267  265       Latest Ref Rng & Units 12/10/2023    2:58 PM 06/12/2023    2:30 PM 12/19/2022    2:01 PM  CMP  Glucose 70 - 99 mg/dL 67  85  897   BUN 6 - 20 mg/dL 12  17  16    Creatinine 0.44 - 1.00 mg/dL 9.23  9.20  9.32   Sodium 135 - 145 mmol/L 140  142  139   Potassium 3.5 - 5.1 mmol/L 4.5  4.8  4.0   Chloride 98 - 111 mmol/L 103  106  105   CO2 22 - 32 mmol/L 28  27  26    Calcium 8.9 - 10.3 mg/dL 9.9  9.5  9.5   Total Protein 6.5 - 8.1 g/dL 7.5  7.1  7.5   Total Bilirubin 0.0 - 1.2 mg/dL 0.4  0.6  0.6   Alkaline Phos 38 - 126 U/L 112  91  89   AST 15 - 41 U/L 31  41  23   ALT 0 - 44 U/L 28  44  20     DIAGNOSTIC IMAGING:  I have independently reviewed the scans and discussed with the patient. No results found.   WRAP UP:  All questions were answered. The patient knows to call the clinic with any problems, questions or concerns.  Medical decision making: ***  Time spent on visit: I spent {CHL ONC TIME VISIT - DTPQU:8845999869} counseling the patient face to face. The total time spent in the appointment was {CHL ONC TIME VISIT - DTPQU:[id number]} and more than 50% was on counseling.  Pleasant CHRISTELLA Barefoot, PA-C  ***

## 2023-12-17 ENCOUNTER — Inpatient Hospital Stay: Admitting: Physician Assistant

## 2023-12-17 ENCOUNTER — Encounter: Payer: Self-pay | Admitting: Physician Assistant

## 2023-12-17 VITALS — BP 134/78 | HR 88 | Temp 97.5°F | Resp 18 | Ht 67.0 in | Wt 160.0 lb

## 2023-12-17 DIAGNOSIS — Z803 Family history of malignant neoplasm of breast: Secondary | ICD-10-CM | POA: Diagnosis not present

## 2023-12-17 DIAGNOSIS — Z1239 Encounter for other screening for malignant neoplasm of breast: Secondary | ICD-10-CM

## 2023-12-17 DIAGNOSIS — E559 Vitamin D deficiency, unspecified: Secondary | ICD-10-CM

## 2023-12-17 DIAGNOSIS — C50912 Malignant neoplasm of unspecified site of left female breast: Secondary | ICD-10-CM

## 2023-12-17 DIAGNOSIS — Z9189 Other specified personal risk factors, not elsewhere classified: Secondary | ICD-10-CM | POA: Diagnosis not present

## 2023-12-17 NOTE — Patient Instructions (Signed)
 [facility] at [facility] **VISIT SUMMARY & IMPORTANT INSTRUCTIONS **   You were seen today by Pleasant Barefoot PA-C for your follow-up visit.    HISTORY OF LEFT BREAST CANCER Your labs and physical exam did not show any concern for recurrent breast cancer at this time. You are due for mammogram in January 2026.  Please discuss with your surgeon and primary care provider in [place] to get this scheduled. Please fax results of your mammogram to our office at [phone] (Attention: Alesi Zachery PA-C)  OSTEOPENIA You have some weakening in your bones which can be related to your previous chemotherapy, as well as your current breast cancer pill (anastrozole ). Continue calcium, vitamin D , and weightbearing exercises to improve bone strength. You are due for bone density scan in January 2026.  Please discuss this with your surgeon and/or primary care provider in [place] to get this scheduled.  Please fax results of your bone density scanto our office at [phone] (Attention: Brandn Mcgath PA-C).  OTHER CONCERNS: - Based on your personal and family history of breast cancer at a relatively young age, I have referred you for genetic counseling and possible repeat genetic testing. - It is unclear if you meet criteria for high risk breast cancer screening protocol.  We will discuss this in more detail at your follow-up visit in 6 months.  If you qualify for this, we will schedule you for MRI breast in June/July 2026.  FOLLOW-UP APPOINTMENT: 6 months  ** Thank you for trusting me with your healthcare!  I strive to provide all of my patients with quality care at each visit.  If you receive a survey for this visit, I would be so grateful to you for taking the time to provide feedback.  Thank you in advance!  ~ Lizzy Hamre                                        Dr. Mickiel Davonna Pleasant Barefoot, PA-C          Delon Hope, NP   - - - - - - - - -  - - - - - - - - -    Thank you for choosing [facility] at [facility] to provide your oncology and hematology care.  To afford each patient quality time with our provider, please arrive at least 15 minutes before your scheduled appointment time.   If you have a lab appointment with the Cancer Center please come in thru the Main Entrance and check in at the main information desk.  You need to re-schedule your appointment should you arrive 10 or more minutes late.  We strive to give you quality time with our providers, and arriving late affects you and other patients whose appointments are after yours.  Also, if you no show three or more times for appointments you may be dismissed from the clinic at the providers discretion.     Again, thank you for choosing [facility].  Our hope is that these requests will decrease the amount of time that you wait before being seen by our physicians.       _____________________________________________________________  Should you have questions after your visit to [facility], please contact our office at [phone] and follow the prompts.  Our office hours are 8:00  a.m. and 4:30 p.m. Monday - Friday.  Please note that voicemails left after 4:00 p.m. may not be returned until the following business day.  We are closed weekends and major holidays.  You do have access to a nurse 24-7, just call the main number to the clinic [phone] and do not press any options, hold on the line and a nurse will answer the phone.    For prescription refill requests, have your pharmacy contact our office and allow 72 hours.

## 2024-01-05 ENCOUNTER — Encounter: Payer: Self-pay | Admitting: *Deleted

## 2024-01-15 ENCOUNTER — Telehealth: Payer: Self-pay | Admitting: Licensed Clinical Social Worker

## 2024-01-15 ENCOUNTER — Inpatient Hospital Stay: Admitting: Licensed Clinical Social Worker

## 2024-01-15 NOTE — Telephone Encounter (Signed)
 Received voicemail from patient that she can no longer make 1/8 telephone genetic counseling appointment. Left message letting her know the appointment is cancelled and gave her my number to call when she would like to reschedule.    Dena Cary, MS, [facility] An Genetic Counselor [email or web address] Phone: [phone]

## 2024-06-09 ENCOUNTER — Inpatient Hospital Stay

## 2024-06-16 ENCOUNTER — Inpatient Hospital Stay: Admitting: Physician Assistant
